# Patient Record
Sex: Female | Born: 1955 | Race: White | Hispanic: No | Smoking: Current some day smoker
Health system: Southern US, Community
[De-identification: ages and names within clinical notes are randomized; demographics above are authoritative.]

## PROBLEM LIST (undated history)

## (undated) DIAGNOSIS — I219 Acute myocardial infarction, unspecified: Secondary | ICD-10-CM

## (undated) DIAGNOSIS — I10 Essential (primary) hypertension: Secondary | ICD-10-CM

## (undated) DIAGNOSIS — I739 Peripheral vascular disease, unspecified: Secondary | ICD-10-CM

## (undated) DIAGNOSIS — I2510 Atherosclerotic heart disease of native coronary artery without angina pectoris: Secondary | ICD-10-CM

## (undated) DIAGNOSIS — K219 Gastro-esophageal reflux disease without esophagitis: Secondary | ICD-10-CM

## (undated) DIAGNOSIS — E119 Type 2 diabetes mellitus without complications: Secondary | ICD-10-CM

## (undated) DIAGNOSIS — I779 Disorder of arteries and arterioles, unspecified: Secondary | ICD-10-CM

## (undated) DIAGNOSIS — G2581 Restless legs syndrome: Secondary | ICD-10-CM

## (undated) DIAGNOSIS — E785 Hyperlipidemia, unspecified: Secondary | ICD-10-CM

## (undated) DIAGNOSIS — G459 Transient cerebral ischemic attack, unspecified: Secondary | ICD-10-CM

## (undated) DIAGNOSIS — B029 Zoster without complications: Secondary | ICD-10-CM

## (undated) HISTORY — DX: Type 2 diabetes mellitus without complications: E11.9

## (undated) HISTORY — PX: TUBAL LIGATION: SHX77

## (undated) HISTORY — DX: Essential (primary) hypertension: I10

## (undated) HISTORY — PX: COLONOSCOPY: SHX174

## (undated) HISTORY — DX: Hyperlipidemia, unspecified: E78.5

## (undated) HISTORY — DX: Atherosclerotic heart disease of native coronary artery without angina pectoris: I25.10

## (undated) HISTORY — PX: HEEL SPUR RESECTION: SHX6410

## (undated) HISTORY — DX: Disorder of arteries and arterioles, unspecified: I77.9

## (undated) HISTORY — PX: CYST EXCISION PERINEAL: SHX6278

## (undated) HISTORY — PX: CARDIAC CATHETERIZATION: SHX172

## (undated) HISTORY — PX: APPENDECTOMY: SHX54

## (undated) HISTORY — DX: Peripheral vascular disease, unspecified: I73.9

## (undated) HISTORY — PX: CRANIOTOMY: SHX93

---

## 1999-05-15 ENCOUNTER — Ambulatory Visit (HOSPITAL_BASED_OUTPATIENT_CLINIC_OR_DEPARTMENT_OTHER): Admission: RE | Admit: 1999-05-15 | Discharge: 1999-05-15 | Payer: Self-pay | Admitting: Orthopedic Surgery

## 1999-09-18 ENCOUNTER — Encounter: Payer: Self-pay | Admitting: Emergency Medicine

## 1999-09-18 ENCOUNTER — Emergency Department (HOSPITAL_COMMUNITY): Admission: EM | Admit: 1999-09-18 | Discharge: 1999-09-18 | Payer: Self-pay | Admitting: Emergency Medicine

## 1999-10-10 ENCOUNTER — Encounter: Payer: Self-pay | Admitting: Emergency Medicine

## 1999-10-10 ENCOUNTER — Encounter: Admission: RE | Admit: 1999-10-10 | Discharge: 1999-10-10 | Payer: Self-pay | Admitting: Emergency Medicine

## 1999-10-10 ENCOUNTER — Ambulatory Visit (HOSPITAL_COMMUNITY): Admission: RE | Admit: 1999-10-10 | Discharge: 1999-10-10 | Payer: Self-pay | Admitting: Emergency Medicine

## 2001-08-10 ENCOUNTER — Emergency Department (HOSPITAL_COMMUNITY): Admission: EM | Admit: 2001-08-10 | Discharge: 2001-08-10 | Payer: Self-pay

## 2004-09-30 ENCOUNTER — Encounter: Admission: RE | Admit: 2004-09-30 | Discharge: 2004-11-11 | Payer: Self-pay | Admitting: Emergency Medicine

## 2006-01-20 ENCOUNTER — Encounter: Admission: RE | Admit: 2006-01-20 | Discharge: 2006-03-03 | Payer: Self-pay | Admitting: Emergency Medicine

## 2009-07-07 DIAGNOSIS — B029 Zoster without complications: Secondary | ICD-10-CM

## 2009-07-07 HISTORY — DX: Zoster without complications: B02.9

## 2015-09-19 ENCOUNTER — Telehealth: Payer: Self-pay | Admitting: Cardiology

## 2015-09-19 NOTE — Telephone Encounter (Signed)
[facility] records [facility] for appointment on 09/21/15 with Dr Jens Somrenshaw.  [facility] for Dr Ludwig Clarksrenshaw's schedule on 09/21/15. lp

## 2015-09-20 NOTE — Progress Notes (Signed)
HPI: 60 year old female for evaluation of coronary artery disease. Previously followed in [place]. Patient had a non-ST elevation myocardial infarction in September 2016. Cardiac catheterization at that time revealed normal left main, 40-50% mid LAD, 10-20% proximal circumflex, 99% second obtuse marginal and 40-50% mid RCA. There was spasm of the RCA noted. Patient had PCI of the obtuse marginal with drug-eluting stent. Echocardiogram showed normal overall LV function, mild aortic/mitral/tricuspid regurgitation. Patient had a monitor performed in December 2016 secondary to presyncope. Based on report it showed sinus rhythm with periods of sinus tachycardia and sinus bradycardia. There were sinus pauses noted during sleeping hours. Her beta blocker was discontinued. Strips not available. Patient has occasional dyspnea related to brilinta but not severe. She has not had recurrent chest pain. She was having dizzy spells prior to metoprolol being discontinued but not at present. She has bilateral lower extremity pain with ambulating 30 yards.  Current Outpatient Prescriptions  Medication Sig Dispense Refill  . amLODipine (NORVASC) 5 MG tablet Take 5 mg by mouth daily.    Marland Kitchen aspirin EC 81 MG tablet Take 81 mg by mouth daily.    Marland Kitchen buPROPion (WELLBUTRIN) 75 MG tablet Take 75 mg by mouth daily.    . Calcium Carbonate-Vitamin D (CALCIUM-VITAMIN D) 500-200 MG-UNIT tablet Take 1 tablet by mouth 2 (two) times daily.    . chlorthalidone (HYGROTON) 25 MG tablet Take 25 mg by mouth daily.    . Loratadine (CLARITIN) 10 MG CAPS Take 10 mg by mouth daily.    . metFORMIN (GLUCOPHAGE) 500 MG tablet Take 500 mg by mouth 2 (two) times daily.    . nitroGLYCERIN (NITROSTAT) 0.4 MG SL tablet Place 1 tablet under the tongue as needed.    . Omega-3 Fatty Acids (FISH OIL) 1000 MG CAPS Take 1 capsule by mouth 2 (two) times daily.    . ticagrelor (BRILINTA) 90 MG TABS tablet Take 90 mg by mouth 2 (two) times daily.      No current facility-administered medications for this visit.    Allergies  Allergen Reactions  . Carbamazepine     Other reaction(s): Other Abnormal labs, liver enzymes were elevated  . Dilantin  [Phenytoin] Hives  . Other Other (See Comments)    Elevated liver function     Past Medical History  Diagnosis Date  . CAD (coronary artery disease)   . Diabetes mellitus (HCC)   . Hypertension   . Hyperlipidemia     Past Surgical History  Procedure Laterality Date  . Appendectomy    . Heel spur resection      Social History   Social History  . Marital Status: Widowed    Spouse Name: N/A  . Number of Children: 2  . Years of Education: N/A   Occupational History  . Not on file.   Social History Main Topics  . Smoking status: Current Every Day Smoker -- 0.50 packs/day    Types: Cigarettes  . Smokeless tobacco: Not on file  . Alcohol Use: No  . Drug Use: Not on file  . Sexual Activity: Not on file   Other Topics Concern  . Not on file   Social History Narrative  . No narrative on file    Family History  Problem Relation Age of Onset  . CAD Son   . CAD Father     ROS: Claudication but no fevers or chills, productive cough, hemoptysis, dysphasia, odynophagia, melena, hematochezia, dysuria, hematuria, rash, seizure activity, orthopnea, PND, pedal edema.  Remaining systems are negative.  Physical Exam:   Blood pressure 130/60, pulse 96, height 5\' 3"  (1.6 m), weight 158 lb 11.2 oz (71.986 kg).  General:  Well developed/well nourished in NAD Skin warm/dry Patient not depressed No peripheral clubbing Back-normal HEENT-normal/normal eyelids Neck supple/normal carotid upstroke bilaterally; bilateral bruits; no JVD; no thyromegaly chest - CTA/ normal expansion CV - RRR/normal S1 and S2; no murmurs, rubs or gallops;  PMI nondisplaced Abdomen -NT/ND, no HSM, no mass, + bowel sounds, no bruit 1+ femoral pulses, no bruits Ext-no edema, chords, Diminished distal  pulses Neuro-grossly nonfocal  ECG Sinus rhythm at a rate of 96. Cannot rule out prior septal infarct. Left axis deviation.

## 2015-09-21 ENCOUNTER — Encounter: Payer: Self-pay | Admitting: Cardiology

## 2015-09-21 ENCOUNTER — Ambulatory Visit (INDEPENDENT_AMBULATORY_CARE_PROVIDER_SITE_OTHER): Payer: 59 | Admitting: Cardiology

## 2015-09-21 VITALS — BP 130/60 | HR 96 | Ht 63.0 in | Wt 158.7 lb

## 2015-09-21 DIAGNOSIS — E785 Hyperlipidemia, unspecified: Secondary | ICD-10-CM

## 2015-09-21 DIAGNOSIS — I2510 Atherosclerotic heart disease of native coronary artery without angina pectoris: Secondary | ICD-10-CM | POA: Insufficient documentation

## 2015-09-21 DIAGNOSIS — R0989 Other specified symptoms and signs involving the circulatory and respiratory systems: Secondary | ICD-10-CM

## 2015-09-21 DIAGNOSIS — I10 Essential (primary) hypertension: Secondary | ICD-10-CM | POA: Diagnosis not present

## 2015-09-21 DIAGNOSIS — I739 Peripheral vascular disease, unspecified: Secondary | ICD-10-CM | POA: Insufficient documentation

## 2015-09-21 DIAGNOSIS — Z720 Tobacco use: Secondary | ICD-10-CM | POA: Insufficient documentation

## 2015-09-21 MED ORDER — ROSUVASTATIN CALCIUM 40 MG PO TABS: 40.0000 mg | ORAL_TABLET | Freq: Every day | ORAL | Status: DC

## 2015-09-21 NOTE — Assessment & Plan Note (Addendum)
Patient sounds to be having bilateral lower extremity claudication. Check ABIs with Doppler. May need PV consult.

## 2015-09-21 NOTE — Patient Instructions (Signed)
Medication Instructions:   START ROSUVASTATIN 40 MG ONCE DAILY  Labwork:  Your physician recommends that you return for lab work in: 4 WEEKS = DO NOT EAT PRIOR TO LAB WORK  Testing/Procedures:  Your physician has requested that you have a carotid duplex. This test is an ultrasound of the carotid arteries in your neck. It looks at blood flow through these arteries that supply the brain with blood. Allow one hour for this exam. There are no restrictions or special instructions.   Your physician has requested that you have a lower extremity arterial duplex. During this test, ultrasound are used to evaluate arterial blood flow in the legs. Allow one hour for this exam. There are no restrictions or special instructions.   Follow-Up:  Your physician wants you to follow-up in: 6 MONTHS WITH DR Jens SomRENSHAW You will receive a reminder letter in the mail two months in advance. If you don't receive a letter, please call our office to schedule the follow-up appointment.

## 2015-09-21 NOTE — Assessment & Plan Note (Signed)
Continue ASA and brilinta; She is having occasional mild dyspnea that appears to not be significantly problematic. If it worsens we could transition to Plavix. Add statin.

## 2015-09-21 NOTE — Assessment & Plan Note (Signed)
Patient counseled on discontinuing. 

## 2015-09-21 NOTE — Assessment & Plan Note (Addendum)
Blood pressure controlled. Continue present medications. Avoid beta blockers given history of pauses.

## 2015-09-21 NOTE — Assessment & Plan Note (Signed)
Patient had myalgias with Lipitor. We will try Crestor 10 mg daily. Check lipids and liver in 4 weeks.

## 2015-09-21 NOTE — Assessment & Plan Note (Signed)
Schedule carotid Dopplers. 

## 2015-09-24 ENCOUNTER — Other Ambulatory Visit: Payer: Self-pay | Admitting: Cardiology

## 2015-09-24 DIAGNOSIS — I739 Peripheral vascular disease, unspecified: Secondary | ICD-10-CM

## 2015-10-03 ENCOUNTER — Ambulatory Visit (HOSPITAL_COMMUNITY)
Admission: RE | Admit: 2015-10-03 | Discharge: 2015-10-03 | Disposition: A | Discharge status: Home / Self Care | Payer: 59 | Source: Ambulatory Visit | Attending: Cardiovascular Disease | Admitting: Cardiovascular Disease

## 2015-10-03 DIAGNOSIS — I10 Essential (primary) hypertension: Secondary | ICD-10-CM | POA: Insufficient documentation

## 2015-10-03 DIAGNOSIS — R0989 Other specified symptoms and signs involving the circulatory and respiratory systems: Secondary | ICD-10-CM | POA: Insufficient documentation

## 2015-10-03 DIAGNOSIS — I739 Peripheral vascular disease, unspecified: Secondary | ICD-10-CM

## 2015-10-03 DIAGNOSIS — I6523 Occlusion and stenosis of bilateral carotid arteries: Secondary | ICD-10-CM | POA: Diagnosis not present

## 2015-10-03 DIAGNOSIS — E785 Hyperlipidemia, unspecified: Secondary | ICD-10-CM | POA: Insufficient documentation

## 2015-10-03 DIAGNOSIS — I70203 Unspecified atherosclerosis of native arteries of extremities, bilateral legs: Secondary | ICD-10-CM | POA: Insufficient documentation

## 2015-10-03 DIAGNOSIS — E119 Type 2 diabetes mellitus without complications: Secondary | ICD-10-CM | POA: Diagnosis not present

## 2015-10-03 DIAGNOSIS — I2510 Atherosclerotic heart disease of native coronary artery without angina pectoris: Secondary | ICD-10-CM | POA: Insufficient documentation

## 2015-10-03 DIAGNOSIS — I745 Embolism and thrombosis of iliac artery: Secondary | ICD-10-CM | POA: Insufficient documentation

## 2015-10-03 DIAGNOSIS — R938 Abnormal findings on diagnostic imaging of other specified body structures: Secondary | ICD-10-CM | POA: Diagnosis not present

## 2015-10-05 ENCOUNTER — Encounter: Payer: Self-pay | Admitting: Cardiology

## 2015-10-05 NOTE — Telephone Encounter (Signed)
New message  ° ° °Patient calling for test results.   °

## 2015-10-05 NOTE — Telephone Encounter (Signed)
This encounter was created in error - please disregard.

## 2015-10-19 ENCOUNTER — Encounter: Payer: 59 | Admitting: Cardiovascular Disease

## 2015-10-23 ENCOUNTER — Encounter: Payer: Self-pay | Admitting: *Deleted

## 2015-10-23 LAB — LIPID PANEL
CHOL/HDL RATIO: 2.9 ratio (ref <=5.0)
CHOLESTEROL: 123 mg/dL — AB (ref 125–200)
HDL: 43 mg/dL — ABNORMAL LOW (ref >=46)
LDL Cholesterol: 59 mg/dL (ref <130)
Triglycerides: 105 mg/dL (ref <150)
VLDL: 21 mg/dL (ref <30)

## 2015-10-23 LAB — HEPATIC FUNCTION PANEL
ALK PHOS: 83 U/L (ref 33–130)
ALT: 14 U/L (ref 6–29)
AST: 14 U/L (ref 10–35)
Albumin: 4 g/dL (ref 3.6–5.1)
BILIRUBIN DIRECT: 0.1 mg/dL (ref <=0.2)
BILIRUBIN INDIRECT: 0.3 mg/dL (ref 0.2–1.2)
BILIRUBIN TOTAL: 0.4 mg/dL (ref 0.2–1.2)
Total Protein: 6.7 g/dL (ref 6.1–8.1)

## 2015-10-30 ENCOUNTER — Encounter: Payer: Self-pay | Admitting: Cardiovascular Disease

## 2015-10-30 ENCOUNTER — Ambulatory Visit (INDEPENDENT_AMBULATORY_CARE_PROVIDER_SITE_OTHER): Payer: 59 | Admitting: Cardiovascular Disease

## 2015-10-30 ENCOUNTER — Other Ambulatory Visit: Payer: Self-pay | Admitting: *Deleted

## 2015-10-30 ENCOUNTER — Ambulatory Visit
Admission: RE | Admit: 2015-10-30 | Discharge: 2015-10-30 | Disposition: A | Discharge status: Home / Self Care | Payer: 59 | Source: Ambulatory Visit | Attending: Cardiovascular Disease | Admitting: Cardiovascular Disease

## 2015-10-30 DIAGNOSIS — I779 Disorder of arteries and arterioles, unspecified: Secondary | ICD-10-CM | POA: Insufficient documentation

## 2015-10-30 DIAGNOSIS — I739 Peripheral vascular disease, unspecified: Secondary | ICD-10-CM | POA: Diagnosis not present

## 2015-10-30 LAB — BASIC METABOLIC PANEL
BUN: 11 mg/dL (ref 7–25)
CHLORIDE: 101 mmol/L (ref 98–110)
CO2: 27 mmol/L (ref 20–31)
Calcium: 9.5 mg/dL (ref 8.6–10.4)
Creat: 0.69 mg/dL (ref 0.50–0.99)
Glucose, Bld: 205 mg/dL — ABNORMAL HIGH (ref 65–99)
POTASSIUM: 4 mmol/L (ref 3.5–5.3)
SODIUM: 138 mmol/L (ref 135–146)

## 2015-10-30 LAB — CBC WITH DIFFERENTIAL/PLATELET
BASOS PCT: 0 %
Basophils Absolute: 0 cells/uL (ref 0–200)
EOS ABS: 178 {cells}/uL (ref 15–500)
Eosinophils Relative: 2 %
HEMATOCRIT: 33.5 % — AB (ref 35.0–45.0)
HEMOGLOBIN: 10 g/dL — AB (ref 11.7–15.5)
LYMPHS ABS: 2225 {cells}/uL (ref 850–3900)
Lymphocytes Relative: 25 %
MCH: 22.5 pg — ABNORMAL LOW (ref 27.0–33.0)
MCHC: 29.9 g/dL — ABNORMAL LOW (ref 32.0–36.0)
MCV: 75.3 fL — AB (ref 80.0–100.0)
MONO ABS: 801 {cells}/uL (ref 200–950)
MPV: 9 fL (ref 7.5–12.5)
Monocytes Relative: 9 %
NEUTROS ABS: 5696 {cells}/uL (ref 1500–7800)
Neutrophils Relative %: 64 %
Platelets: 359 10*3/uL (ref 140–400)
RBC: 4.45 MIL/uL (ref 3.80–5.10)
RDW: 16.8 % — ABNORMAL HIGH (ref 11.0–15.0)
WBC: 8.9 10*3/uL (ref 3.8–10.8)

## 2015-10-30 LAB — TSH: TSH: 1.32 m[IU]/L

## 2015-10-30 LAB — PROTIME-INR
INR: 1 (ref <1.50)
Prothrombin Time: 13.3 seconds (ref 11.6–15.2)

## 2015-10-30 LAB — APTT: aPTT: 32 seconds (ref 24–37)

## 2015-10-30 NOTE — Patient Instructions (Signed)
Medication Instructions:  Your physician recommends that you continue on your current medications as directed. Please refer to the Current Medication list given to you today.   Labwork: Your physician recommends that you return for lab work in: TODAY (bmet, cbc, tsh, ptt, pt/inr) The lab can be found on the FIRST FLOOR of out building in [address]   Testing/Procedures: Dr. Allyson SabalBerry has ordered a peripheral [facility].  This procedure is going to look at the bloodflow in your lower extremities.  If Dr. Allyson SabalBerry is able to open up the arteries, you will have to spend one night in the hospital.  If he is not able to open the arteries, you will be able to go home that same day.    After the procedure, you will not be allowed to drive for 3 days or push, pull, or lift anything greater than 10 lbs for one week.    You will be required to have the following tests prior to the procedure:  1. Blood work-the blood work can be done no more than 7 days prior to the procedure.  [facility] be done at any [facility] lab.  There is one downstairs on the first floor of this building and one in the [facility] building [phone]([address], [address])  2. Chest Xray-the chest xray order has [facility].     *REPS: Minerva AreolaEric and Todd  Puncture site: Right Groin      Any Other Special Instructions Will Be Listed Below (If Applicable).     If you need a refill on your cardiac medications before your next appointment, please call your pharmacy.

## 2015-10-30 NOTE — Assessment & Plan Note (Signed)
History of bilateral lower extremity lifestyle limiting claudication over the last several months at less than 100 feet. Dopplers performed in the office 10/03/15 revealed an occluded right common iliac and high-grade left common iliac artery stenosis with right ABI 0.68 and a left 89. She will need angiography and potential endovascular therapy. I have gone over the procedure in detail the patient agrees to proceed.

## 2015-10-30 NOTE — Assessment & Plan Note (Signed)
[name] has moderate bilateral eyes see a disease by recent duplex ultrasound 10/03/15 left greater than right. She is neurologically asymptomatic on dual antiplatelet therapy.

## 2015-10-30 NOTE — Progress Notes (Signed)
   10/30/2015 Debra Dennis   06/23/1956  [id number]  Primary Physician CYNTHIA BUTLER, DO Primary Cardiologist: Omelia Marquart J. Kalden Wanke MD FACP,FACC,FAHA, FSCAI   HPI:  Debra Dennis is a 59-year-old moderately overweight widowed Caucasian female mother of 2 children, grandmother and one grandchild referred by Dr. Crenshaw for peripheral vascular evaluation because of claudication. Her primary care provider is Dr. Cynthia Butler. 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 Risk factors include 40 pack years of tobacco abuse currently smoking one half pack per day, treated hypertension, hyperlipidemia and diabetes. She did have a non-ST segment elevation myocardial infarctions in September of last year and had stenting of her second obtuse marginal branch with a drug-eluting stent. She is on total and 5 platelet therapy. Since that time she's noticed less tolerating claudication which is symmetric. She had Dopplers performed in our office 3/29/17revealing a right ABI 0.68 with an occluded right common iliac and left ABI 0.9 with high-grade left common iliac artery stenosis. She also has moderate bilateral internal carotid artery stenosis by duplex ultrasound left greater than right.   Current Outpatient Prescriptions  Medication Sig Dispense Refill  . amLODipine (NORVASC) 5 MG tablet Take 5 mg by mouth daily.    . aspirin EC 81 MG tablet Take 81 mg by mouth daily.    . buPROPion (WELLBUTRIN) 75 MG tablet Take 75 mg by mouth daily.    . Calcium Carbonate-Vitamin D (CALCIUM-VITAMIN D) 500-200 MG-UNIT tablet Take 1 tablet by mouth 2 (two) times daily.    . chlorthalidone (HYGROTON) 25 MG tablet Take 25 mg by mouth daily.    . Loratadine (CLARITIN) 10 MG CAPS Take 10 mg by mouth daily.    . metFORMIN (GLUCOPHAGE) 500 MG tablet Take 500 mg by mouth 2 (two) times daily.    . nitroGLYCERIN (NITROSTAT) 0.4 MG SL tablet Place 1 tablet under the tongue as needed.    . Omega-3 Fatty Acids (FISH  OIL) 1000 MG CAPS Take 1 capsule by mouth 2 (two) times daily.    . rosuvastatin (CRESTOR) 40 MG tablet Take 1 tablet (40 mg total) by mouth daily. 90 tablet 3  . ticagrelor (BRILINTA) 90 MG TABS tablet Take 90 mg by mouth 2 (two) times daily.     No current facility-administered medications for this visit.    Allergies  Allergen Reactions  . Carbamazepine     Other reaction(s): Other Abnormal labs, liver enzymes were elevated  . Dilantin  [Phenytoin] Hives  . Other Other (See Comments)    Elevated liver function    Social History   Social History  . Marital Status: Widowed    Spouse Name: N/A  . Number of Children: 2  . Years of Education: N/A   Occupational History  . Not on file.   Social History Main Topics  . Smoking status: Current Every Day Smoker -- 0.50 packs/day    Types: Cigarettes  . Smokeless tobacco: Not on file  . Alcohol Use: No  . Drug Use: Not on file  . Sexual Activity: Not on file   Other Topics Concern  . Not on file   Social History Narrative     Review of Systems: General: negative for chills, fever, night sweats or weight changes.  Cardiovascular: negative for chest pain, dyspnea on exertion, edema, orthopnea, palpitations, paroxysmal nocturnal dyspnea or shortness of breath Dermatological: negative for rash Respiratory: negative for cough or wheezing Urologic: negative for hematuria Abdominal: negative for nausea,   vomiting, diarrhea, bright red blood per rectum, melena, or hematemesis Neurologic: negative for visual changes, syncope, or dizziness All other systems reviewed and are otherwise negative except as noted above.    Height 5\' 3"  (1.6 m), weight 165 lb (74.844 kg).  General appearance: alert and no distress Neck: no adenopathy, no JVD, supple, symmetrical, trachea midline, thyroid not enlarged, symmetric, no tenderness/mass/nodules and bilateral carotid bruits Lungs: clear to auscultation bilaterally Heart: regular rate and  rhythm, S1, S2 normal, no murmur, click, rub or gallop Extremities: extremities normal, atraumatic, no cyanosis or edema  EKG not performed today  ASSESSMENT AND PLAN:   Bilateral carotid artery disease [facility](HCC) Debra Dennis has moderate bilateral eyes see a disease by recent duplex ultrasound 10/03/15 left greater than right. She is neurologically asymptomatic on dual antiplatelet therapy.  Claudication [facility](HCC) History of bilateral lower extremity lifestyle limiting claudication over the last several months at less than 100 feet. Dopplers performed in the office 10/03/15 revealed an occluded right common iliac and high-grade left common iliac artery stenosis with right ABI 0.68 and a left 89. She will need angiography and potential endovascular therapy. I have gone over the procedure in detail the patient agrees to proceed.      Runell GessJonathan J. Edna Rede MD FACP,FACC,FAHA, [facility] 10/30/2015 10:29 AM

## 2015-11-05 ENCOUNTER — Encounter (HOSPITAL_COMMUNITY)
Admission: RE | Disposition: A | Discharge status: Home / Self Care | Payer: Self-pay | Source: Ambulatory Visit | Attending: Cardiovascular Disease

## 2015-11-05 ENCOUNTER — Ambulatory Visit (HOSPITAL_COMMUNITY)
Admission: RE | Admit: 2015-11-05 | Discharge: 2015-11-05 | Disposition: A | Discharge status: Home / Self Care | Payer: 59 | Source: Ambulatory Visit | Attending: Cardiovascular Disease | Admitting: Cardiovascular Disease

## 2015-11-05 ENCOUNTER — Encounter (HOSPITAL_COMMUNITY): Payer: Self-pay | Admitting: Cardiovascular Disease

## 2015-11-05 DIAGNOSIS — E785 Hyperlipidemia, unspecified: Secondary | ICD-10-CM | POA: Diagnosis not present

## 2015-11-05 DIAGNOSIS — Z955 Presence of coronary angioplasty implant and graft: Secondary | ICD-10-CM | POA: Diagnosis not present

## 2015-11-05 DIAGNOSIS — I70213 Atherosclerosis of native arteries of extremities with intermittent claudication, bilateral legs: Secondary | ICD-10-CM

## 2015-11-05 DIAGNOSIS — I739 Peripheral vascular disease, unspecified: Secondary | ICD-10-CM | POA: Diagnosis present

## 2015-11-05 DIAGNOSIS — I252 Old myocardial infarction: Secondary | ICD-10-CM | POA: Diagnosis not present

## 2015-11-05 DIAGNOSIS — E1151 Type 2 diabetes mellitus with diabetic peripheral angiopathy without gangrene: Secondary | ICD-10-CM | POA: Diagnosis not present

## 2015-11-05 DIAGNOSIS — F17210 Nicotine dependence, cigarettes, uncomplicated: Secondary | ICD-10-CM | POA: Insufficient documentation

## 2015-11-05 DIAGNOSIS — Z7982 Long term (current) use of aspirin: Secondary | ICD-10-CM | POA: Diagnosis not present

## 2015-11-05 DIAGNOSIS — Z7984 Long term (current) use of oral hypoglycemic drugs: Secondary | ICD-10-CM | POA: Diagnosis not present

## 2015-11-05 DIAGNOSIS — I7092 Chronic total occlusion of artery of the extremities: Secondary | ICD-10-CM | POA: Diagnosis not present

## 2015-11-05 DIAGNOSIS — I10 Essential (primary) hypertension: Secondary | ICD-10-CM | POA: Diagnosis not present

## 2015-11-05 DIAGNOSIS — I6523 Occlusion and stenosis of bilateral carotid arteries: Secondary | ICD-10-CM | POA: Insufficient documentation

## 2015-11-05 HISTORY — PX: PERIPHERAL VASCULAR CATHETERIZATION: SHX172C

## 2015-11-05 LAB — GLUCOSE, CAPILLARY
Glucose-Capillary: 181 mg/dL — ABNORMAL HIGH (ref 65–99)
Glucose-Capillary: 159 mg/dL — ABNORMAL HIGH (ref 65–99)

## 2015-11-05 SURGERY — LOWER EXTREMITY ANGIOGRAPHY

## 2015-11-05 MED ORDER — HEPARIN (PORCINE) IN NACL 2-0.9 UNIT/ML-% IJ SOLN
INTRAMUSCULAR | Status: AC
  Filled 2015-11-05: qty 1000

## 2015-11-05 MED ORDER — HEPARIN (PORCINE) IN NACL 2-0.9 UNIT/ML-% IJ SOLN
INTRAMUSCULAR | Status: DC | PRN
  Administered 2015-11-05: 1000 mL

## 2015-11-05 MED ORDER — SODIUM CHLORIDE 0.9 % WEIGHT BASED INFUSION
1.0000 mL/kg/h | INTRAVENOUS | Status: DC
  Administered 2015-11-05: 1 mL/kg/h via INTRAVENOUS

## 2015-11-05 MED ORDER — SODIUM CHLORIDE 0.9 % IV SOLN: INTRAVENOUS | Status: DC

## 2015-11-05 MED ORDER — ASPIRIN 81 MG PO CHEW
81.0000 mg | CHEWABLE_TABLET | ORAL | Status: DC
Start: 2015-11-05 — End: 2015-11-05

## 2015-11-05 MED ORDER — NITROGLYCERIN 1 MG/10 ML FOR IR/CATH LAB
INTRA_ARTERIAL | Status: AC
  Filled 2015-11-05: qty 10

## 2015-11-05 MED ORDER — ACETAMINOPHEN 325 MG PO TABS: 650.0000 mg | ORAL_TABLET | ORAL | Status: DC | PRN

## 2015-11-05 MED ORDER — MIDAZOLAM HCL 2 MG/2ML IJ SOLN
INTRAMUSCULAR | Status: DC | PRN
  Administered 2015-11-05 (×2): 1 mg via INTRAVENOUS

## 2015-11-05 MED ORDER — MORPHINE SULFATE (PF) 2 MG/ML IV SOLN: 2.0000 mg | INTRAVENOUS | Status: DC | PRN

## 2015-11-05 MED ORDER — FENTANYL CITRATE (PF) 100 MCG/2ML IJ SOLN
INTRAMUSCULAR | Status: AC
  Filled 2015-11-05: qty 2

## 2015-11-05 MED ORDER — IODIXANOL 320 MG/ML IV SOLN
INTRAVENOUS | Status: DC | PRN
  Administered 2015-11-05: 150 mL via INTRAVENOUS

## 2015-11-05 MED ORDER — ASPIRIN EC 325 MG PO TBEC: 325.0000 mg | DELAYED_RELEASE_TABLET | Freq: Every day | ORAL | Status: DC

## 2015-11-05 MED ORDER — LIDOCAINE HCL (PF) 1 % IJ SOLN
INTRAMUSCULAR | Status: DC | PRN
  Administered 2015-11-05: 2 mL
  Administered 2015-11-05: 25 mL

## 2015-11-05 MED ORDER — SODIUM CHLORIDE 0.9% FLUSH: 3.0000 mL | INTRAVENOUS | Status: DC | PRN

## 2015-11-05 MED ORDER — SODIUM CHLORIDE 0.9 % WEIGHT BASED INFUSION
3.0000 mL/kg/h | INTRAVENOUS | Status: AC
  Administered 2015-11-05: 3 mL/kg/h via INTRAVENOUS

## 2015-11-05 MED ORDER — VERAPAMIL HCL 2.5 MG/ML IV SOLN
INTRAVENOUS | Status: AC
  Filled 2015-11-05: qty 2

## 2015-11-05 MED ORDER — MIDAZOLAM HCL 2 MG/2ML IJ SOLN
INTRAMUSCULAR | Status: AC
  Filled 2015-11-05: qty 2

## 2015-11-05 MED ORDER — FENTANYL CITRATE (PF) 100 MCG/2ML IJ SOLN
INTRAMUSCULAR | Status: DC | PRN
  Administered 2015-11-05 (×2): 25 ug via INTRAVENOUS

## 2015-11-05 MED ORDER — VERAPAMIL HCL 2.5 MG/ML IV SOLN
INTRA_ARTERIAL | Status: DC | PRN
  Administered 2015-11-05 (×2): 10 mL via INTRA_ARTERIAL

## 2015-11-05 MED ORDER — ONDANSETRON HCL 4 MG/2ML IJ SOLN: 4.0000 mg | Freq: Four times a day (QID) | INTRAMUSCULAR | Status: DC | PRN

## 2015-11-05 MED ORDER — LIDOCAINE HCL (PF) 1 % IJ SOLN
INTRAMUSCULAR | Status: AC
  Filled 2015-11-05: qty 30

## 2015-11-05 SURGICAL SUPPLY — 14 items
CATH ANGIO 5F PIGTAIL 100CM (CATHETERS) ×4 IMPLANT
CATH INFINITI JR4 5F (CATHETERS) ×4 IMPLANT
DEVICE RAD COMP TR BAND LRG (VASCULAR PRODUCTS) ×4 IMPLANT
GLIDESHEATH SLEND A-KIT 6F 22G (SHEATH) ×4 IMPLANT
KIT PV (KITS) ×4 IMPLANT
NEEDLE SMART REG 18GX2-3/4 (NEEDLE) ×4 IMPLANT
SHEATH PINNACLE 5F 10CM (SHEATH) ×4 IMPLANT
STOPCOCK MORSE 400PSI 3WAY (MISCELLANEOUS) ×4 IMPLANT
SYRINGE MEDRAD AVANTA MACH 7 (SYRINGE) ×4 IMPLANT
TRANSDUCER W/STOPCOCK (MISCELLANEOUS) ×4 IMPLANT
TRAY PV CATH (CUSTOM PROCEDURE TRAY) ×4 IMPLANT
TUBING CIL FLEX 10 FLL-RA (TUBING) ×8 IMPLANT
WIRE HITORQ VERSACORE ST 145CM (WIRE) ×4 IMPLANT
WIRE SAFE-T 1.5MM-J .035X260CM (WIRE) ×4 IMPLANT

## 2015-11-05 NOTE — H&P (View-Only) (Signed)
10/30/2015 [name]   [birth year]  [id number]  Primary Physician Samuel Jester, DO Primary Cardiologist: Runell Gess MD Roseanne Reno   HPI:  [name] is a 60 year old moderately overweight widowed Caucasian female mother of 2 children, grandmother and one grandchild referred by Dr. Jens Som for peripheral vascular evaluation because of claudication. Her primary care provider is Dr. Samuel Jester. 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 Risk factors include 40 pack years of tobacco abuse currently smoking one half pack per day, treated hypertension, hyperlipidemia and diabetes. She did have a non-ST segment elevation myocardial infarctions in September of last year and had stenting of her second obtuse marginal branch with a drug-eluting stent. She is on total and 5 platelet therapy. Since that time she's noticed less tolerating claudication which is symmetric. She had Dopplers performed in our office 3/29/17revealing a right ABI 0.68 with an occluded right common iliac and left ABI 0.9 with high-grade left common iliac artery stenosis. She also has moderate bilateral internal carotid artery stenosis by duplex ultrasound left greater than right.   Current Outpatient Prescriptions  Medication Sig Dispense Refill  . amLODipine (NORVASC) 5 MG tablet Take 5 mg by mouth daily.    Marland Kitchen aspirin EC 81 MG tablet Take 81 mg by mouth daily.    Marland Kitchen buPROPion (WELLBUTRIN) 75 MG tablet Take 75 mg by mouth daily.    . Calcium Carbonate-Vitamin D (CALCIUM-VITAMIN D) 500-200 MG-UNIT tablet Take 1 tablet by mouth 2 (two) times daily.    . chlorthalidone (HYGROTON) 25 MG tablet Take 25 mg by mouth daily.    . Loratadine (CLARITIN) 10 MG CAPS Take 10 mg by mouth daily.    . metFORMIN (GLUCOPHAGE) 500 MG tablet Take 500 mg by mouth 2 (two) times daily.    . nitroGLYCERIN (NITROSTAT) 0.4 MG SL tablet Place 1 tablet under the tongue as needed.    . Omega-3 Fatty Acids (FISH  OIL) 1000 MG CAPS Take 1 capsule by mouth 2 (two) times daily.    . rosuvastatin (CRESTOR) 40 MG tablet Take 1 tablet (40 mg total) by mouth daily. 90 tablet 3  . ticagrelor (BRILINTA) 90 MG TABS tablet Take 90 mg by mouth 2 (two) times daily.     No current facility-administered medications for this visit.    Allergies  Allergen Reactions  . Carbamazepine     Other reaction(s): Other Abnormal labs, liver enzymes were elevated  . Dilantin  [Phenytoin] Hives  . Other Other (See Comments)    Elevated liver function    Social History   Social History  . Marital Status: Widowed    Spouse Name: N/A  . Number of Children: 2  . Years of Education: N/A   Occupational History  . Not on file.   Social History Main Topics  . Smoking status: Current Every Day Smoker -- 0.50 packs/day    Types: Cigarettes  . Smokeless tobacco: Not on file  . Alcohol Use: No  . Drug Use: Not on file  . Sexual Activity: Not on file   Other Topics Concern  . Not on file   Social History Narrative     Review of Systems: General: negative for chills, fever, night sweats or weight changes.  Cardiovascular: negative for chest pain, dyspnea on exertion, edema, orthopnea, palpitations, paroxysmal nocturnal dyspnea or shortness of breath Dermatological: negative for rash Respiratory: negative for cough or wheezing Urologic: negative for hematuria Abdominal: negative for nausea,  vomiting, diarrhea, bright red blood per rectum, melena, or hematemesis Neurologic: negative for visual changes, syncope, or dizziness All other systems reviewed and are otherwise negative except as noted above.    Height 5\' 3"  (1.6 m), weight 165 lb (74.844 kg).  General appearance: alert and no distress Neck: no adenopathy, no JVD, supple, symmetrical, trachea midline, thyroid not enlarged, symmetric, no tenderness/mass/nodules and bilateral carotid bruits Lungs: clear to auscultation bilaterally Heart: regular rate and  rhythm, S1, S2 normal, no murmur, click, rub or gallop Extremities: extremities normal, atraumatic, no cyanosis or edema  EKG not performed today  ASSESSMENT AND PLAN:   Bilateral carotid artery disease [facility](HCC) Mrs. Murguia has moderate bilateral eyes see a disease by recent duplex ultrasound 10/03/15 left greater than right. She is neurologically asymptomatic on dual antiplatelet therapy.  Claudication [facility](HCC) History of bilateral lower extremity lifestyle limiting claudication over the last several months at less than 100 feet. Dopplers performed in the office 10/03/15 revealed an occluded right common iliac and high-grade left common iliac artery stenosis with right ABI 0.68 and a left 89. She will need angiography and potential endovascular therapy. I have gone over the procedure in detail the patient agrees to proceed.      Runell GessJonathan J. Yadiel Aubry MD FACP,FACC,FAHA, [facility] 10/30/2015 10:29 AM

## 2015-11-05 NOTE — Interval H&P Note (Signed)
History and Physical Interval Note:  11/05/2015 7:33 AM  Debra Dennis  has presented today for surgery, with the diagnosis of claudication  The various methods of treatment have been discussed with the patient and family. After consideration of risks, benefits and other options for treatment, the patient has consented to  Procedure(s): Lower Extremity Angiography (N/A) as a surgical intervention .  The patient's history has been reviewed, patient examined, no change in status, stable for surgery.  I have reviewed the patient's chart and labs.  Questions were answered to the patient's satisfaction.     Nanetta BattyBerry, Genee Rann

## 2015-11-05 NOTE — Discharge Instructions (Signed)
Radial Site Care °Refer to this sheet in the next few weeks. These instructions provide you with information about caring for yourself after your procedure. Your health care provider may also give you more specific instructions. Your treatment has been planned according to current medical practices, but problems sometimes occur. Call your health care provider if you have any problems or questions after your procedure. °WHAT TO EXPECT AFTER THE PROCEDURE °After your procedure, it is typical to have the following: °· Bruising at the radial site that usually fades within 1-2 weeks. °· Blood collecting in the tissue (hematoma) that may be painful to the touch. It should usually decrease in size and tenderness within 1-2 weeks. °HOME CARE INSTRUCTIONS °· Take medicines only as directed by your health care provider. °· You may shower 24-48 hours after the procedure or as directed by your health care provider. Remove the bandage (dressing) and gently wash the site with plain soap and water. Pat the area dry with a clean towel. Do not rub the site, because this may cause bleeding. °· Do not take baths, swim, or use a hot tub until your health care provider approves. °· Check your insertion site every day for redness, swelling, or drainage. °· Do not apply powder or lotion to the site. °· Do not flex or bend the affected arm for 24 hours or as directed by your health care provider. °· Do not push or pull heavy objects with the affected arm for 24 hours or as directed by your health care provider. °· Do not lift over 10 lb (4.5 kg) for 5 days after your procedure or as directed by your health care provider. °· Ask your health care provider when it is okay to: °¨ Return to work or school. °¨ Resume usual physical activities or sports. °¨ Resume sexual activity. °· Do not drive home if you are discharged the same day as the procedure. Have someone else drive you. °· You may drive 24 hours after the procedure unless otherwise  instructed by your health care provider. °· Do not operate machinery or power tools for 24 hours after the procedure. °· If your procedure was done as an outpatient procedure, which means that you went home the same day as your procedure, a responsible adult should be with you for the first 24 hours after you arrive home. °· Keep all follow-up visits as directed by your health care provider. This is important. °SEEK MEDICAL CARE IF: °· You have a fever. °· You have chills. °· You have increased bleeding from the radial site. Hold pressure on the site. °SEEK IMMEDIATE MEDICAL CARE IF: °· You have unusual pain at the radial site. °· You have redness, warmth, or swelling at the radial site. °· You have drainage (other than a small amount of blood on the dressing) from the radial site. °· The radial site is bleeding, and the bleeding does not stop after 30 minutes of holding steady pressure on the site. °· Your arm or hand becomes pale, cool, tingly, or numb. °  °This information is not intended to replace advice given to you by your health care provider. Make sure you discuss any questions you have with your health care provider. °  °Document Released: 07/26/2010 Document Revised: 07/14/2014 Document Reviewed: 01/09/2014 °Elsevier Interactive Patient Education ©2016 [employer] ° °

## 2015-11-07 ENCOUNTER — Telehealth: Payer: Self-pay | Admitting: Cardiovascular Disease

## 2015-11-07 NOTE — Telephone Encounter (Signed)
New message   Pt is calling to speak to Dr.Berry's Rn about her recent procedure

## 2015-11-07 NOTE — Telephone Encounter (Signed)
Spoke with pt, she was told when she was discharged from the hospital she would be getting a call about seeing a Careers advisersurgeon, she has not gotten any call. She wants to know if there is any pain medication he can give her to help with the pain in her legs while working. She also needs a note to return to work Monday and would like that faxed to her home fax # [phone]. Will forward for dr berry's review

## 2015-11-08 NOTE — Telephone Encounter (Signed)
It is okay for her to return to work. Once I see her back in the office I will refer her to Dr. Myra GianottiBrabham for surgical evaluation.

## 2015-11-09 NOTE — Telephone Encounter (Signed)
Left pt detailed message on home phone, okay per DPR. Letter faxed to line approving her to return to work on 5/8.  Also, informed pt that her referral for surgeon will be reviewed at her next appt with Dr. Allyson SabalBerry.

## 2015-11-12 ENCOUNTER — Telehealth: Payer: Self-pay | Admitting: Cardiovascular Disease

## 2015-11-12 NOTE — Telephone Encounter (Signed)
Pt received return to work letter.   Pt also wanted to setup f/u with Dr. Allyson SabalBerry and discuss next steps. Pt also told to take Acetaminophen for leg pain until f/u; 5/19.   Pt verbalized understanding, no questions at this time.

## 2015-11-12 NOTE — Telephone Encounter (Signed)
F/u  Pt returning RN phone call- concerning paperwork to return to work- Please call back and discuss.

## 2015-11-23 ENCOUNTER — Ambulatory Visit (INDEPENDENT_AMBULATORY_CARE_PROVIDER_SITE_OTHER): Payer: 59 | Admitting: Cardiovascular Disease

## 2015-11-23 ENCOUNTER — Encounter: Payer: Self-pay | Admitting: Cardiovascular Disease

## 2015-11-23 VITALS — BP 134/70 | HR 96 | Ht 63.0 in | Wt 161.0 lb

## 2015-11-23 DIAGNOSIS — I739 Peripheral vascular disease, unspecified: Secondary | ICD-10-CM

## 2015-11-23 DIAGNOSIS — Z01810 Encounter for preprocedural cardiovascular examination: Secondary | ICD-10-CM | POA: Diagnosis not present

## 2015-11-23 NOTE — Assessment & Plan Note (Signed)
[name] returns for follow-up after her recent lower extremity extremity arteriogram which I performed via the right radial approach on 11/05/15 because of severe lifestyle limiting claudication. She had high-grade distal dominant aortic stenosis, an occluded right iliac and high-grade left common iliac artery stenosis. This was not percutaneously addressable. She will require aortobifemoral bypass grafting.she had a circumflex obtuse marginal branch stent placed in September at [facility]. She will need to be on dual antiplatelet therapy for 12 months uninterrupted. I'm going to refer her to Dr. Myra GianottiBrabham for peripheral vascular evaluation and aortobifemoral bypass grafting in fall of this year.

## 2015-11-23 NOTE — Progress Notes (Signed)
11/23/2015 Debra HickBarbara Dennis   1956-05-05  [id number]  Primary Physician Samuel JesterYNTHIA BUTLER, DO Primary Cardiologist: Runell GessJonathan J. Berry MD Roseanne RenoFACP,FACC,FAHA, FSCAI   HPI:  .Debra Dennis is a 60 year old moderately overweight widowed Caucasian female mother of 2 children, grandmother and one grandchild referred by Dr. Jens Somrenshaw for peripheral vascular evaluation because of claudication. Her primary care provider is Dr. Samuel Jesterynthia Dennis. I initially saw her in the office 10/30/15. 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 Risk factors include 40 pack years of tobacco abuse currently smoking one half pack per day, treated hypertension, hyperlipidemia and diabetes. She did have a non-ST segment elevation myocardial infarctions in September of last year and had stenting of her second obtuse marginal branch with a drug-eluting stent. She is on total and 5 platelet therapy. Since that time she's noticed less tolerating claudication which is symmetric. She had Dopplers performed in our office 3/29/17revealing a right ABI 0.68 with an occluded right common iliac and left ABI 0.9 with high-grade left common iliac artery stenosis. She also has moderate bilateral internal carotid artery stenosis by duplex ultrasound left greater than right. She underwent lower extremity angiography by myself 11/05/15 via the right radial approach demonstrating severe aortoiliac disease not percutaneously addressable. She will need aortobifemoral bypass grafting. Because of her percutaneous coronary intervention in September 2016 at [facility] she will need to be on uninterrupted dual antibiotic therapy for at least 12 months therefore her vascular surgical procedure will need to be delayed until the fall of this year At that time, we'll get a pharmacologic Myoview stress test to risk stratify her. We also talked about the importance of smoking cessation. We will continue to follow her lower extremity Doppler studies here are  office after revascularization as well as her carotid Doppler studies.  Current Outpatient Prescriptions  Medication Sig Dispense Refill  . amLODipine (NORVASC) 5 MG tablet Take 5 mg by mouth daily.    Marland Kitchen. aspirin EC 81 MG tablet Take 81 mg by mouth daily.    Marland Kitchen. buPROPion (WELLBUTRIN SR) 150 MG 12 hr tablet Take 150 mg by mouth daily.    . Calcium Carbonate-Vitamin D (CALCIUM-VITAMIN D) 500-200 MG-UNIT tablet Take 1 tablet by mouth 2 (two) times daily.    . chlorthalidone (HYGROTON) 25 MG tablet Take 25 mg by mouth daily.    . Loratadine (CLARITIN) 10 MG CAPS Take 10 mg by mouth daily.    . metFORMIN (GLUCOPHAGE) 500 MG tablet Take 500 mg by mouth 2 (two) times daily.    . nitroGLYCERIN (NITROSTAT) 0.4 MG SL tablet Place 1 tablet under the tongue every 5 (five) minutes as needed for chest pain.     . Omega-3 Fatty Acids (FISH OIL) 1000 MG CAPS Take 1 capsule by mouth 2 (two) times daily.    Marland Kitchen. omeprazole (PRILOSEC) 20 MG capsule Take 20 mg by mouth 2 (two) times daily.    Marland Kitchen. rOPINIRole (REQUIP) 0.25 MG tablet Take 0.25 mg by mouth 3 (three) times daily.    . rosuvastatin (CRESTOR) 40 MG tablet Take 1 tablet (40 mg total) by mouth daily. 90 tablet 3  . ticagrelor (BRILINTA) 90 MG TABS tablet Take 90 mg by mouth 2 (two) times daily.     No current facility-administered medications for this visit.    Allergies  Allergen Reactions  . Carbamazepine     Other reaction(s): Other Abnormal labs, liver enzymes were elevated  . Dilantin  [Phenytoin] Hives  .  Other Other (See Comments)    Elevated liver function    Social History   Social History  . Marital Status: Widowed    Spouse Name: N/A  . Number of Children: 2  . Years of Education: N/A   Occupational History  . Not on file.   Social History Main Topics  . Smoking status: Current Every Day Smoker -- 0.50 packs/day    Types: Cigarettes  . Smokeless tobacco: Not on file  . Alcohol Use: No  . Drug Use: Not on file  . Sexual  Activity: Not on file   Other Topics Concern  . Not on file   Social History Narrative     Review of Systems: General: negative for chills, fever, night sweats or weight changes.  Cardiovascular: negative for chest pain, dyspnea on exertion, edema, orthopnea, palpitations, paroxysmal nocturnal dyspnea or shortness of breath Dermatological: negative for rash Respiratory: negative for cough or wheezing Urologic: negative for hematuria Abdominal: negative for nausea, vomiting, diarrhea, bright red blood per rectum, melena, or hematemesis Neurologic: negative for visual changes, syncope, or dizziness All other systems reviewed and are otherwise negative except as noted above.    Blood pressure 134/70, pulse 96, height  (1.6 m), weight 161 lb (73.029 kg), SpO2 96 %.  General appearance: alert and no distress Neck: no adenopathy, no JVD, supple, symmetrical, trachea midline, thyroid not enlarged, symmetric, no tenderness/mass/nodules and bilateral carotid bruits Lungs: clear to auscultation bilaterally Heart: regular rate and rhythm, S1, S2 normal, no murmur, click, rub or gallop Extremities: extremities normal, atraumatic, no cyanosis or edema  EKG not performed today  ASSESSMENT AND PLAN:   Claudication [facility]) Debra Dennis returns for follow-up after her recent lower extremity extremity arteriogram which I performed via the right radial approach on 11/05/15 because of severe lifestyle limiting claudication. She had high-grade distal dominant aortic stenosis, an occluded right iliac and high-grade left common iliac artery stenosis. This was not percutaneously addressable. She will require aortobifemoral bypass grafting.she had a circumflex obtuse marginal branch stent placed in September at [facility]. She will need to be on dual antiplatelet therapy for 12 months uninterrupted. I'm going to refer her to Dr. Myra Gianotti for peripheral vascular evaluation and aortobifemoral bypass grafting  in fall of this year.      Runell Gess MD FACP,FACC,FAHA, [facility] 11/23/2015 9:39 AM

## 2015-11-23 NOTE — Patient Instructions (Addendum)
Your physician has requested that you have a lexiscan myoview in August 2017. For further information please visit [email or web address] Please follow instruction sheet, as given.  You have been referred to Dr Janae BridgemanW Brabham at [facility].  Dr Allyson SabalBerry recommends that you schedule a follow-up appointment in 6 months. You will receive a reminder letter in the mail two months in advance. If you don't receive a letter, please call our office to schedule the follow-up appointment.  If you need a refill on your cardiac medications before your next appointment, please call your pharmacy.

## 2015-12-04 ENCOUNTER — Encounter: Payer: Self-pay | Admitting: Surgery

## 2015-12-10 ENCOUNTER — Encounter: Payer: Self-pay | Admitting: Surgery

## 2015-12-10 ENCOUNTER — Ambulatory Visit (INDEPENDENT_AMBULATORY_CARE_PROVIDER_SITE_OTHER): Payer: 59 | Admitting: Surgery

## 2015-12-10 VITALS — BP 127/70 | HR 79 | Temp 99.0°F | Resp 18 | Ht 63.0 in | Wt 165.0 lb

## 2015-12-10 DIAGNOSIS — I70213 Atherosclerosis of native arteries of extremities with intermittent claudication, bilateral legs: Secondary | ICD-10-CM | POA: Diagnosis not present

## 2015-12-10 NOTE — Progress Notes (Signed)
Vascular and Vein Specialist of [place]  Patient name: Debra Dennis MRN: [id number] DOB: 1956/04/07 Sex: female  REFERRING PHYSICIAN: Dr. Allyson Sabal  REASON FOR CONSULT: claudication  HPI: Debra Dennis is a 60 y.o. female, who is referred today for evaluation of bilateral claudication.  The patient has recently undergone angiography by Dr. Gery Pray which revealed a 90% ostial calcified left common iliac lesion and an occluded right common and external iliac artery.  The patient had three-vessel runoff bilaterally.  She was felt to not be a good candidate for percutaneous revascularization and therefore was referred for surgical evaluation.  The patient reports that she gets symptoms after walking approximately 100 feet.  Her right leg bothers were the same as the left.  She states that her legs feel tight and feel like they're going to give out.  She denies ulcers or rest pain.  The patient has a significant coronary artery history.  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  She suffers from hypercholesterolemia which is managed with a statin.  She is on dual antiplatelet therapy.  She continues to be a half a pack a day smoker.  She has undergone ablation for trigeminal neuralgia.  The patient has an abdominal surgical history consisting of appendectomy and tubal ligation  Past Medical History  Diagnosis Date  . CAD (coronary artery disease)   . Diabetes mellitus (HCC)   . Hypertension   . Hyperlipidemia   . Bilateral carotid artery disease (HCC)   . Claudication [facility])     Family History  Problem Relation Age of Onset  . CAD Son   . CAD Father     SOCIAL HISTORY: Social History   Social History  . Marital Status: Widowed    Spouse Name: N/A  . Number of Children: 2  . Years of Education: N/A   Occupational History  . Not on file.   Social History Main Topics  . Smoking status: Current Some Day  Smoker -- 0.50 packs/day    Types: Cigarettes  . Smokeless tobacco: Not on file  . Alcohol Use: No  . Drug Use: Not on file  . Sexual Activity: Not on file   Other Topics Concern  . Not on file   Social History Narrative    Allergies  Allergen Reactions  . Carbamazepine     Other reaction(s): Other Abnormal labs, liver enzymes were elevated  . Dilantin  [Phenytoin] Hives  . Other Other (See Comments)    Elevated liver function    Current Outpatient Prescriptions  Medication Sig Dispense Refill  . amLODipine (NORVASC) 5 MG tablet Take 5 mg by mouth daily.    Marland Kitchen aspirin EC 81 MG tablet Take 81 mg by mouth daily.    Marland Kitchen buPROPion (WELLBUTRIN SR) 150 MG 12 hr tablet Take 150 mg by mouth daily.    . Calcium Carbonate-Vitamin D (CALCIUM-VITAMIN D) 500-200 MG-UNIT tablet Take 1 tablet by mouth 2 (two) times daily.    . chlorthalidone (HYGROTON) 25 MG tablet Take 25 mg by mouth daily.    . Loratadine (CLARITIN) 10 MG CAPS Take 10 mg by mouth daily.    . metFORMIN (GLUCOPHAGE) 500 MG tablet Take 500 mg by mouth 2 (two) times daily.    . nitroGLYCERIN (NITROSTAT) 0.4 MG SL tablet Place 1 tablet under the tongue every 5 (five) minutes as needed for chest pain.     . Omega-3 Fatty Acids (FISH OIL) 1000 MG  CAPS Take 1 capsule by mouth 2 (two) times daily.    Marland Kitchen omeprazole (PRILOSEC) 20 MG capsule Take 20 mg by mouth 2 (two) times daily.    Marland Kitchen rOPINIRole (REQUIP) 0.25 MG tablet Take 0.25 mg by mouth 3 (three) times daily.    . rosuvastatin (CRESTOR) 40 MG tablet Take 1 tablet (40 mg total) by mouth daily. 90 tablet 3  . ticagrelor (BRILINTA) 90 MG TABS tablet Take 90 mg by mouth 2 (two) times daily.     No current facility-administered medications for this visit.    REVIEW OF SYSTEMS:   denotes positive finding,  denotes negative finding Cardiac  Comments:  Chest pain or chest pressure:    Shortness of breath upon exertion: x   Short of breath when lying flat:    Irregular heart  rhythm: x       Vascular    Pain in calf, thigh, or hip brought on by ambulation: x   Pain in feet at night that wakes you up from your sleep:     Blood clot in your veins:    Leg swelling:         Pulmonary    Oxygen at home:    Productive cough:     Wheezing:         Neurologic    Sudden weakness in arms or legs:     Sudden numbness in arms or legs:     Sudden onset of difficulty speaking or slurred speech:    Temporary loss of vision in one eye:     Problems with dizziness:         Gastrointestinal    Blood in stool:     Vomited blood:         Genitourinary    Burning when urinating:     Blood in urine:        Psychiatric    Major depression:         Hematologic    Bleeding problems:    Problems with blood clotting too easily:        Skin    Rashes or ulcers:        Constitutional    Fever or chills:      PHYSICAL EXAM: Filed Vitals:   12/10/15 0928  BP: 127/70  Pulse: 79  Temp: 99 F (37.2 C)  TempSrc: Oral  Resp: 18  Height:  (1.6 m)  Weight: 165 lb (74.844 kg)  SpO2: 96%    GENERAL: The patient is a well-nourished female, in no acute distress. The vital signs are documented above. CARDIAC: There is a regular rate and rhythm.  VASCULAR: Nonpalpable pedal pulses.  No carotid bruits PULMONARY: There is good air exchange bilaterally without wheezing or rales. ABDOMEN: Soft and non-tender with normal pitched bowel sounds.  MUSCULOSKELETAL: There are no major deformities or cyanosis. NEUROLOGIC: No focal weakness or paresthesias are detected. SKIN: There are no ulcers or rashes noted. PSYCHIATRIC: The patient has a normal affect.  DATA:  Carotid duplex: 40-59 percent right carotid stenosis 60-79% left carotid stenosis ABI: Right equal 0.68.  Left equal 0.9  MEDICAL ISSUES: Aortoiliac occlusive disease: The patient has lifestyle limiting claudication symptoms.  She is not a candidate for percutaneous intervention.  She will need an  aortobifemoral bypass graft.  She had a MI in September 2016 which was treated with a drug-eluting stent.  She will require dual antiplatelet therapy for 1 year.  I discussed the  details of the operation.  I am scheduling her to follow-up in early September for her preoperative visit and further discussions regarding aortobifemoral bypass graft.  I am going to get a CT angiogram prior to her visit for better definition of her anatomy.   Durene CalWells Yanci Bachtell, MD [facility] Tel [phone] Pager [phone]

## 2015-12-12 NOTE — Addendum Note (Signed)
Addended by: Melodye PedMANESS-HARRISON, Mikiah Demond C on: 12/12/2015 04:00 PM   Modules accepted: Orders

## 2016-02-21 ENCOUNTER — Telehealth (HOSPITAL_COMMUNITY): Payer: Self-pay

## 2016-02-21 NOTE — Telephone Encounter (Signed)
Encounter complete. 

## 2016-02-26 ENCOUNTER — Ambulatory Visit (HOSPITAL_COMMUNITY)
Admission: RE | Admit: 2016-02-26 | Discharge: 2016-02-26 | Disposition: A | Discharge status: Home / Self Care | Payer: 59 | Source: Ambulatory Visit | Attending: Cardiovascular Disease | Admitting: Cardiovascular Disease

## 2016-02-26 DIAGNOSIS — I739 Peripheral vascular disease, unspecified: Secondary | ICD-10-CM | POA: Diagnosis not present

## 2016-02-26 DIAGNOSIS — E669 Obesity, unspecified: Secondary | ICD-10-CM | POA: Insufficient documentation

## 2016-02-26 DIAGNOSIS — Z6829 Body mass index (BMI) 29.0-29.9, adult: Secondary | ICD-10-CM | POA: Insufficient documentation

## 2016-02-26 DIAGNOSIS — R079 Chest pain, unspecified: Secondary | ICD-10-CM | POA: Insufficient documentation

## 2016-02-26 DIAGNOSIS — I779 Disorder of arteries and arterioles, unspecified: Secondary | ICD-10-CM | POA: Insufficient documentation

## 2016-02-26 DIAGNOSIS — E119 Type 2 diabetes mellitus without complications: Secondary | ICD-10-CM | POA: Diagnosis not present

## 2016-02-26 DIAGNOSIS — Z01810 Encounter for preprocedural cardiovascular examination: Secondary | ICD-10-CM

## 2016-02-26 DIAGNOSIS — Z87891 Personal history of nicotine dependence: Secondary | ICD-10-CM | POA: Diagnosis not present

## 2016-02-26 DIAGNOSIS — I10 Essential (primary) hypertension: Secondary | ICD-10-CM | POA: Insufficient documentation

## 2016-02-26 DIAGNOSIS — R9439 Abnormal result of other cardiovascular function study: Secondary | ICD-10-CM | POA: Diagnosis not present

## 2016-02-26 LAB — MYOCARDIAL PERFUSION IMAGING
CHL CUP RESTING HR STRESS: 83 {beats}/min
CSEPPHR: 104 {beats}/min
LV sys vol: 26 mL
LVDIAVOL: 78 mL (ref 46–106)
SDS: 3
SRS: 3
SSS: 6
TID: 1.14

## 2016-02-26 MED ORDER — TECHNETIUM TC 99M TETROFOSMIN IV KIT
10.4000 | PACK | Freq: Once | INTRAVENOUS | Status: AC | PRN
  Administered 2016-02-26: 10.4 via INTRAVENOUS
  Filled 2016-02-26: qty 10

## 2016-02-26 MED ORDER — TECHNETIUM TC 99M TETROFOSMIN IV KIT
29.0000 | PACK | Freq: Once | INTRAVENOUS | Status: AC | PRN
  Administered 2016-02-26: 29 via INTRAVENOUS
  Filled 2016-02-26: qty 29

## 2016-02-26 MED ORDER — AMINOPHYLLINE 25 MG/ML IV SOLN
75.0000 mg | Freq: Once | INTRAVENOUS | Status: AC
  Administered 2016-02-26: 75 mg via INTRAVENOUS

## 2016-02-26 MED ORDER — REGADENOSON 0.4 MG/5ML IV SOLN
0.4000 mg | Freq: Once | INTRAVENOUS | Status: AC
  Administered 2016-02-26: 0.4 mg via INTRAVENOUS

## 2016-03-11 ENCOUNTER — Other Ambulatory Visit: Payer: 59

## 2016-03-13 ENCOUNTER — Ambulatory Visit
Admission: RE | Admit: 2016-03-13 | Discharge: 2016-03-13 | Disposition: A | Discharge status: Home / Self Care | Payer: 59 | Source: Ambulatory Visit | Attending: Surgery | Admitting: Surgery

## 2016-03-13 DIAGNOSIS — I70213 Atherosclerosis of native arteries of extremities with intermittent claudication, bilateral legs: Secondary | ICD-10-CM

## 2016-03-13 MED ORDER — IOPAMIDOL (ISOVUE-370) INJECTION 76%
80.0000 mL | Freq: Once | INTRAVENOUS | Status: AC | PRN
  Administered 2016-03-13: 80 mL via INTRAVENOUS

## 2016-03-17 ENCOUNTER — Ambulatory Visit: Payer: 59 | Admitting: Surgery

## 2016-03-20 ENCOUNTER — Encounter: Payer: Self-pay | Admitting: Surgery

## 2016-03-24 ENCOUNTER — Ambulatory Visit (INDEPENDENT_AMBULATORY_CARE_PROVIDER_SITE_OTHER): Payer: 59 | Admitting: Surgery

## 2016-03-24 ENCOUNTER — Other Ambulatory Visit: Payer: Self-pay

## 2016-03-24 ENCOUNTER — Telehealth: Payer: Self-pay | Admitting: *Deleted

## 2016-03-24 ENCOUNTER — Encounter: Payer: Self-pay | Admitting: Surgery

## 2016-03-24 VITALS — BP 129/77 | HR 88 | Resp 18 | Ht 63.0 in | Wt 164.2 lb

## 2016-03-24 DIAGNOSIS — I70213 Atherosclerosis of native arteries of extremities with intermittent claudication, bilateral legs: Secondary | ICD-10-CM

## 2016-03-24 NOTE — Telephone Encounter (Signed)
-----   Message from Lewayne BuntingBrian S Crenshaw, MD sent at 03/24/2016 11:35 AM EDT -----   ----- Message ----- From: Nada LibmanVance W Brabham, MD Sent: 03/24/2016   9:46 AM To: Lewayne BuntingBrian S Crenshaw, MD

## 2016-03-24 NOTE — Progress Notes (Signed)
Pt ok for surgery; DC brilinta as we are now one year since prior PCI Olga MillersBrian Crenshaw

## 2016-03-24 NOTE — Telephone Encounter (Signed)
Spoke with pt, Aware of dr crenshaw's recommendations.  °

## 2016-03-24 NOTE — Progress Notes (Signed)
Vascular and Vein Specialist of [place]  Patient name: Debra Dennis MRN: [id number] DOB: 11-24-1955 Sex: female  REASON FOR VISIT: follow up  HPI: Debra Dennis is a 60 y.o. female, who is referred today for evaluation of bilateral claudication.  The patient has recently undergone angiography by Dr. Allyson Sabal which revealed a 90% ostial calcified left common iliac lesion and an occluded right common and external iliac artery.  The patient had three-vessel runoff bilaterally.  She was felt to not be a good candidate for percutaneous revascularization and therefore was referred for surgical evaluation.  The patient reports that she gets symptoms after walking approximately 100 feet.  Her right leg bothers were the same as the left.  She states that her legs feel tight and feel like they're going to give out.  She denies ulcers or rest pain.  The patient has a significant coronary artery history.  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  She suffers from hypercholesterolemia which is managed with a statin.  She is on dual antiplatelet therapy.  She continues to be a half a pack a day smoker.  She has undergone ablation for trigeminal neuralgia.  The patient has an abdominal surgical history consisting of appendectomy and tubal ligation  She reports no changes since I last saw her.   No neurologic symptoms.  Claudication is stable.  No ulcers.  Past Medical History:  Diagnosis Date  . Bilateral carotid artery disease (HCC)   . CAD (coronary artery disease)   . Claudication (HCC)   . Diabetes mellitus (HCC)   . Hyperlipidemia   . Hypertension     Family History  Problem Relation Age of Onset  . CAD Son   . CAD Father     SOCIAL HISTORY: Social History  Substance Use Topics  . Smoking status: Current Some Day Smoker    Packs/day: 0.50    Types: Cigarettes  . Smokeless tobacco: Former Neurosurgeon  . Alcohol  use No    Allergies  Allergen Reactions  . Carbamazepine     Other reaction(s): Other Abnormal labs, liver enzymes were elevated  . Dilantin  [Phenytoin] Hives  . Other Other (See Comments)    Elevated liver function    Current Outpatient Prescriptions  Medication Sig Dispense Refill  . amLODipine (NORVASC) 5 MG tablet Take 5 mg by mouth daily.    Marland Kitchen aspirin EC 81 MG tablet Take 81 mg by mouth daily.    Marland Kitchen buPROPion (WELLBUTRIN SR) 150 MG 12 hr tablet Take 150 mg by mouth daily.    . Calcium Carbonate-Vitamin D (CALCIUM-VITAMIN D) 500-200 MG-UNIT tablet Take 1 tablet by mouth 2 (two) times daily.    . chlorthalidone (HYGROTON) 25 MG tablet Take 25 mg by mouth daily.    Tery Sanfilippo Sodium (COLACE PO) Take by mouth 2 (two) times daily.    . Loratadine (CLARITIN) 10 MG CAPS Take 10 mg by mouth daily.    . metFORMIN (GLUCOPHAGE) 500 MG tablet Take 500 mg by mouth 2 (two) times daily.    . nitroGLYCERIN (NITROSTAT) 0.4 MG SL tablet Place 1 tablet under the tongue every 5 (five) minutes as needed for chest pain.     . Omega-3 Fatty Acids (FISH OIL) 1000 MG CAPS Take 1 capsule by mouth 2 (two) times daily.    Marland Kitchen omeprazole (PRILOSEC) 20 MG capsule Take 20 mg by mouth 2 (two) times daily.    Marland Kitchen rOPINIRole (  REQUIP) 0.25 MG tablet Take 0.25 mg by mouth 3 (three) times daily as needed.     . rosuvastatin (CRESTOR) 40 MG tablet Take 1 tablet (40 mg total) by mouth daily. 90 tablet 3  . ticagrelor (BRILINTA) 90 MG TABS tablet Take 90 mg by mouth 2 (two) times daily.     No current facility-administered medications for this visit.     REVIEW OF SYSTEMS:  [X]  denotes positive finding, [ ]  denotes negative finding Cardiac  Comments:  Chest pain or chest pressure:    Shortness of breath upon exertion: x   Short of breath when lying flat:    Irregular heart rhythm: x       Vascular    Pain in calf, thigh, or hip brought on by ambulation: x   Pain in feet at night that wakes you up from your  sleep:     Blood clot in your veins:    Leg swelling:         Pulmonary    Oxygen at home:    Productive cough:     Wheezing:         Neurologic    Sudden weakness in arms or legs:     Sudden numbness in arms or legs:     Sudden onset of difficulty speaking or slurred speech:    Temporary loss of vision in one eye:     Problems with dizziness:         Gastrointestinal    Blood in stool:     Vomited blood:         Genitourinary    Burning when urinating:     Blood in urine:        Psychiatric    Major depression:         Hematologic    Bleeding problems:    Problems with blood clotting too easily:        Skin    Rashes or ulcers:        Constitutional    Fever or chills:      PHYSICAL EXAM: Vitals:   03/24/16 0846  BP: 129/77  Pulse: 88  Resp: 18  SpO2: 98%  Weight: 164 lb 3.2 oz (74.5 kg)  Height: 5\' 3"  (1.6 m)    GENERAL: The patient is a well-nourished female, in no acute distress. The vital signs are documented above. CARDIAC: There is a regular rate and rhythm.  VASCULAR: left carotid bruit.  Non-palpable pedal pulses PULMONARY: There is good air exchange bilaterally without wheezing or rales. ABDOMEN: Soft and non-tender with normal pitched bowel sounds.  MUSCULOSKELETAL: There are no major deformities or cyanosis. NEUROLOGIC: No focal weakness or paresthesias are detected. SKIN: There are no ulcers or rashes noted. PSYCHIATRIC: The patient has a normal affect.  DATA:  I have reviewed ehr CTA with the following findings 1. 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 2. Mild eccentric nonocclusive plaque in bilateral common femoral arteries. 3. Bilateral adrenal nodules, statistically most likely nonfunctional adrenal adenomas in the absence of a history of primary carcinoma.  MEDICAL ISSUES:  aortoiliac occlusive disease: I discussed with the  patient and her son proceeding with an aortobifemoral bypass graft. We described the details of the procedure as well as the expected hospital stay.  I discussed the potential complications including but not limited to cardiopulmonary complications, intestinal ischemia, incisional hernia, lower extremity  embolization, renal dysfunction.  She understands this and wishes to proceed. I will get formal clearance from Dr. Jens Som. She will need to be off of her Brilinta for 1 weeks.    Durene Cal, MD Vascular and Vein Specialists of [facility] [phone] Pager [phone]

## 2016-03-24 NOTE — Telephone Encounter (Signed)
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

## 2016-03-24 NOTE — Telephone Encounter (Signed)
Returned call as I got notice that she works nights and was going to bed soon.  Called patient to relay clearance. She stated concern since she is not quite at 1 year mark for Brilinta. Advised 5-7 days hold prior to surgery.  Patient aware [facility] will [facility] to [facility] for any further advice. She will be up for a little while - OK to leave msg.

## 2016-03-24 NOTE — Telephone Encounter (Signed)
Pt ok for surgery; DC brilinta as we are now [facility]  Left message for pt to call

## 2016-04-07 NOTE — Progress Notes (Signed)
HPI: FU coronary artery disease. Patient had a non-ST elevation myocardial infarction in September 2016. Cardiac catheterization at that time revealed normal left main, 40-50% mid LAD, 10-20% proximal circumflex, 99% second obtuse marginal and 40-50% mid RCA. There was spasm of the RCA noted. Patient had PCI of the obtuse marginal with drug-eluting stent. Echocardiogram showed normal overall LV function, mild aortic/mitral/tricuspid regurgitation. Patient had a monitor performed in December 2016 secondary to presyncope. Based on report it showed sinus rhythm with periods of sinus tachycardia and sinus bradycardia. There were sinus pauses noted during sleeping hours. Her beta blocker was discontinued. Strips not available. Carotid Dopplers March 2017 showed 60-79% left and 40-59% right stenosis. Lower extremity arteriogram May 2017 showed severe disease in the distal aorta as well as an occluded right common iliac and high-grade left common iliac stenosis. Nuclear study August 2017 showed ejection fraction 66%. There was shifting breast attenuation but no ischemia. Aortobifemoral bypass grafting is planned by Dr.  Myra Gianotti. Since last seen, she denies dyspnea, chest pain, palpitations or syncope. She continues with claudication.  Current Outpatient Prescriptions  Medication Sig Dispense Refill  . amLODipine (NORVASC) 5 MG tablet Take 5 mg by mouth daily.    . Armodafinil 250 MG tablet Take 250 mg by mouth daily as needed. For night shift.    Marland Kitchen artificial tears (LACRILUBE) OINT ophthalmic ointment Place 1 application into the left eye at bedtime as needed (for dry/red/irritated eyes.).    Marland Kitchen aspirin EC 81 MG tablet Take 81 mg by mouth daily.    Marland Kitchen buPROPion (WELLBUTRIN SR) 150 MG 12 hr tablet Take 150 mg by mouth daily.    . Calcium Carbonate-Vitamin D (CALCIUM-VITAMIN D) 500-200 MG-UNIT tablet Take 1 tablet by mouth 2 (two) times daily.    Tery Sanfilippo Sodium (COLACE PO) Take 100 mg by mouth 2 (two)  times daily.     . hydroxypropyl methylcellulose / hypromellose (ISOPTO TEARS / GONIOVISC) 2.5 % ophthalmic solution Place 1 drop into the left eye 3 (three) times daily as needed for dry eyes.    . Loratadine (CLARITIN) 10 MG CAPS Take 10 mg by mouth daily.    . metFORMIN (GLUCOPHAGE) 500 MG tablet Take 500 mg by mouth 2 (two) times daily.    . Multiple Vitamin (MULTIVITAMIN WITH MINERALS) TABS tablet Take 1 tablet by mouth daily.    . nitroGLYCERIN (NITROSTAT) 0.4 MG SL tablet Place 1 tablet under the tongue every 5 (five) minutes as needed for chest pain.     . Omega-3 Fatty Acids (FISH OIL) 1000 MG CAPS Take 1 capsule by mouth 2 (two) times daily.    Marland Kitchen omeprazole (PRILOSEC) 20 MG capsule Take 20 mg by mouth 2 (two) times daily.    Marland Kitchen rOPINIRole (REQUIP) 0.25 MG tablet Take 0.25 mg by mouth 3 (three) times daily as needed. For leg cramps/restless legs.    . rosuvastatin (CRESTOR) 40 MG tablet Take 1 tablet (40 mg total) by mouth daily. 90 tablet 3   No current facility-administered medications for this visit.      Past Medical History:  Diagnosis Date  . Bilateral carotid artery disease (HCC)   . CAD (coronary artery disease)   . Claudication (HCC)   . Diabetes mellitus (HCC)   . Hyperlipidemia   . Hypertension     Past Surgical History:  Procedure Laterality Date  . APPENDECTOMY    . HEEL SPUR RESECTION    . PERIPHERAL VASCULAR CATHETERIZATION Bilateral 11/05/2015  Procedure: Lower Extremity Angiography;  Surgeon: Runell GessJonathan J Berry, MD;  [facility];  Service: Cardiovascular;  Laterality: Bilateral;  . PERIPHERAL VASCULAR CATHETERIZATION N/A 11/05/2015   Procedure: Abdominal Aortogram;  Surgeon: Runell GessJonathan J Berry, MD;  [facility];  Service: Cardiovascular;  Laterality: N/A;    Social History   Social History  . Marital status: Widowed    Spouse name: N/A  . Number of children: 2  . Years of education: N/A   Occupational History  . Not on  file.   Social History Main Topics  . Smoking status: Current Some Day Smoker    Packs/day: 0.50    Types: Cigarettes  . Smokeless tobacco: Former NeurosurgeonUser  . Alcohol use No  . Drug use: Unknown  . Sexual activity: Not on file   Other Topics Concern  . Not on file   Social History Narrative  . No narrative on file    Family History  Problem Relation Age of Onset  . CAD Son   . CAD Father     ROS: Claudication but no fevers or chills, productive cough, hemoptysis, dysphasia, odynophagia, melena, hematochezia, dysuria, hematuria, rash, seizure activity, orthopnea, PND, pedal edema, claudication. Remaining systems are negative.  Physical Exam: Well-developed well-nourished in no acute distress.  Skin is warm and dry.  HEENT is normal.  Neck is supple.  Chest is clear to auscultation with normal expansion.  Cardiovascular exam is regular rate and rhythm.  Abdominal exam nontender or distended. No masses palpated. Extremities show no edema. neuro grossly intact  ECG-sinus rhythm at a rate of 93. Septal infarct. Occasional PVC.  1 coronary artery disease-continue aspirin. Continue statin. Discontinue brilinta.  2 peripheral vascular disease-continue aspirin and statin. She is scheduled for surgical intervention.  3 carotid artery disease-continue aspirin and statin. Follow-up carotid Dopplers March 2018.  4 hyperlipidemia-continue Crestor.  5 tobacco abuse-patient counseled on discontinuing.  6 hypertension-blood pressure controlled. Continue present medications.  7 preoperative evaluation prior to peripheral vascular surgery-no recent chest pain. Patient may proceed with surgery without further cardiac evaluation.  Olga MillersBrian Fletcher Ostermiller, MD

## 2016-04-10 NOTE — Pre-Procedure Instructions (Signed)
Debra HickBarbara Dennis  04/10/2016     Your procedure is scheduled on : Friday April 18, 2016 at 7:30 AM.  [facility] 5:30 AM.  Call this number if you have problems the morning of surgery: [phone]    Remember:  Do not eat food or drink liquids after midnight.  Take these medicines the morning of surgery with A SIP OF WATER : Amlodipine (Norvasc), Bupropion (Wellbutrin), Loratadine (Claritin), Omeprazole (Prilosec), Requip if needed   Stop taking any vitamins, herbal medications/supplements, NSAIDs, Ibuprofen, Advil, Motrin, Aleve, etc today   Do NOT take any diabetic pills the morning of your surgery (NO Metformin/Glucophage)    How to Manage Your Diabetes Before and After Surgery  Why is it important to control my blood sugar before and after surgery? . Improving blood sugar levels before and after surgery helps healing and can limit problems. . A way of improving blood sugar control is eating a healthy diet by: o  Eating less sugar and carbohydrates o  Increasing activity/exercise o  Talking with your doctor about reaching your blood sugar goals . High blood sugars (greater than 180 mg/dL) can raise your risk of infections and slow your recovery, so you will need to focus on controlling your diabetes during the weeks before surgery. . Make sure that the doctor who takes care of your diabetes knows about your planned surgery including the date and location.  How do I manage my blood sugar before surgery? . Check your blood sugar at least 4 times a day, starting 2 days before surgery, to make sure that the level is not too high or low. o Check your blood sugar the morning of your surgery when you wake up and every 2 hours until you get to the Short Stay unit. . If your blood sugar is less than 70 mg/dL, you will need to treat for low blood sugar: o Do not take insulin. o Treat a low blood sugar (less than 70 mg/dL) with  cup of clear juice  (cranberry or apple), 4 glucose tablets, OR glucose gel. o Recheck blood sugar in 15 minutes after treatment (to make sure it is greater than 70 mg/dL). If your blood sugar is not greater than 70 mg/dL on recheck, call [phone] for further instructions. . Report your blood sugar to the short stay nurse when you get to Short Stay.  . If you are admitted to the hospital after surgery: o Your blood sugar will be checked by the staff and you will probably be given insulin after surgery (instead of oral diabetes medicines) to make sure you have good blood sugar levels. o The goal for blood sugar control after surgery is 80-180 mg/dL.     WHAT DO I DO ABOUT MY DIABETES MEDICATION?   Marland Kitchen. Do not take oral diabetes medicines (pills) the morning of surgery.  Reviewed and Endorsed by [facility] Patient Education Committee, August 2015   Do not wear jewelry, make-up or nail polish.  Do not wear lotions, powders, or perfumes, or deoderant.  Do not shave 48 hours prior to surgery.    Do not bring valuables to the hospital.  [facility] is not responsible for any belongings or valuables.  Contacts, dentures or bridgework may not be worn into surgery.  Leave your suitcase in the car.  After surgery it may be brought to your room.  For patients admitted to the hospital, discharge time will be determined by your treatment team.  Patients discharged the day of surgery will not be allowed to drive home.   Name and phone number of your driver:    Special instructions: Shower using CHG soap the night before and the morning of your surgery  Please read over the following fact sheets that you were given. Pain Booklet and MRSA Information

## 2016-04-11 ENCOUNTER — Encounter (HOSPITAL_COMMUNITY)
Admission: RE | Admit: 2016-04-11 | Discharge: 2016-04-11 | Disposition: A | Discharge status: Home / Self Care | Payer: 59 | Source: Ambulatory Visit | Attending: Surgery | Admitting: Surgery

## 2016-04-11 ENCOUNTER — Encounter: Payer: Self-pay | Admitting: Cardiology

## 2016-04-11 ENCOUNTER — Ambulatory Visit (INDEPENDENT_AMBULATORY_CARE_PROVIDER_SITE_OTHER): Payer: 59 | Admitting: Cardiology

## 2016-04-11 ENCOUNTER — Encounter (HOSPITAL_COMMUNITY): Payer: Self-pay

## 2016-04-11 ENCOUNTER — Encounter: Payer: Self-pay | Admitting: *Deleted

## 2016-04-11 VITALS — BP 132/78 | HR 93 | Ht 62.0 in | Wt 168.0 lb

## 2016-04-11 DIAGNOSIS — Z01812 Encounter for preprocedural laboratory examination: Secondary | ICD-10-CM | POA: Insufficient documentation

## 2016-04-11 DIAGNOSIS — E785 Hyperlipidemia, unspecified: Secondary | ICD-10-CM | POA: Insufficient documentation

## 2016-04-11 DIAGNOSIS — I2510 Atherosclerotic heart disease of native coronary artery without angina pectoris: Secondary | ICD-10-CM

## 2016-04-11 DIAGNOSIS — I739 Peripheral vascular disease, unspecified: Secondary | ICD-10-CM | POA: Diagnosis not present

## 2016-04-11 DIAGNOSIS — F17210 Nicotine dependence, cigarettes, uncomplicated: Secondary | ICD-10-CM | POA: Insufficient documentation

## 2016-04-11 DIAGNOSIS — E119 Type 2 diabetes mellitus without complications: Secondary | ICD-10-CM | POA: Diagnosis not present

## 2016-04-11 DIAGNOSIS — I10 Essential (primary) hypertension: Secondary | ICD-10-CM | POA: Diagnosis not present

## 2016-04-11 DIAGNOSIS — E7849 Other hyperlipidemia: Secondary | ICD-10-CM

## 2016-04-11 DIAGNOSIS — E784 Other hyperlipidemia: Secondary | ICD-10-CM

## 2016-04-11 DIAGNOSIS — K219 Gastro-esophageal reflux disease without esophagitis: Secondary | ICD-10-CM | POA: Insufficient documentation

## 2016-04-11 DIAGNOSIS — Z7984 Long term (current) use of oral hypoglycemic drugs: Secondary | ICD-10-CM | POA: Diagnosis not present

## 2016-04-11 HISTORY — DX: Transient cerebral ischemic attack, unspecified: G45.9

## 2016-04-11 HISTORY — DX: Restless legs syndrome: G25.81

## 2016-04-11 HISTORY — DX: Acute myocardial infarction, unspecified: I21.9

## 2016-04-11 HISTORY — DX: Gastro-esophageal reflux disease without esophagitis: K21.9

## 2016-04-11 HISTORY — DX: Zoster without complications: B02.9

## 2016-04-11 LAB — TYPE AND SCREEN
ABO/RH(D): O POS
ANTIBODY SCREEN: NEGATIVE

## 2016-04-11 LAB — URINALYSIS, ROUTINE W REFLEX MICROSCOPIC
BILIRUBIN URINE: NEGATIVE
GLUCOSE, UA: NEGATIVE mg/dL
HGB URINE DIPSTICK: NEGATIVE
KETONES UR: NEGATIVE mg/dL
Leukocytes, UA: NEGATIVE
Nitrite: NEGATIVE
PROTEIN: NEGATIVE mg/dL
Specific Gravity, Urine: 1.015 (ref 1.005–1.030)
pH: 5.5 (ref 5.0–8.0)

## 2016-04-11 LAB — COMPREHENSIVE METABOLIC PANEL
ALK PHOS: 75 U/L (ref 38–126)
ALT: 15 U/L (ref 14–54)
ANION GAP: 10 (ref 5–15)
AST: 23 U/L (ref 15–41)
Albumin: 3.4 g/dL — ABNORMAL LOW (ref 3.5–5.0)
BILIRUBIN TOTAL: 0.2 mg/dL — AB (ref 0.3–1.2)
BUN: 9 mg/dL (ref 6–20)
CO2: 25 mmol/L (ref 22–32)
CREATININE: 0.64 mg/dL (ref 0.44–1.00)
Calcium: 9.3 mg/dL (ref 8.9–10.3)
Chloride: 104 mmol/L (ref 101–111)
GFR calc Af Amer: >60 mL/min (ref >60)
Glucose, Bld: 164 mg/dL — ABNORMAL HIGH (ref 65–99)
POTASSIUM: 3.7 mmol/L (ref 3.5–5.1)
Sodium: 139 mmol/L (ref 135–145)
Total Protein: 6 g/dL — ABNORMAL LOW (ref 6.5–8.1)

## 2016-04-11 LAB — BLOOD GAS, ARTERIAL
Acid-Base Excess: 1.1 mmol/L (ref 0.0–2.0)
BICARBONATE: 25.1 mmol/L (ref 20.0–28.0)
Drawn by: 449841
FIO2: 0.21
O2 Saturation: 84 %
PCO2 ART: 38.9 mmHg (ref 32.0–48.0)
PH ART: 7.425 (ref 7.350–7.450)
PO2 ART: 48.5 mmHg — AB (ref 83.0–108.0)
Patient temperature: 98.6

## 2016-04-11 LAB — LIPID PANEL
Cholesterol: 143 mg/dL (ref 0–200)
HDL: 48 mg/dL (ref >40)
LDL CALC: 33 mg/dL (ref 0–99)
Total CHOL/HDL Ratio: 3 RATIO
Triglycerides: 309 mg/dL — ABNORMAL HIGH (ref <150)
VLDL: 62 mg/dL — ABNORMAL HIGH (ref 0–40)

## 2016-04-11 LAB — CBC
HEMATOCRIT: 35.9 % — AB (ref 36.0–46.0)
HEMOGLOBIN: 10.8 g/dL — AB (ref 12.0–15.0)
MCH: 24.3 pg — AB (ref 26.0–34.0)
MCHC: 30.1 g/dL (ref 30.0–36.0)
MCV: 80.7 fL (ref 78.0–100.0)
Platelets: 305 10*3/uL (ref 150–400)
RBC: 4.45 MIL/uL (ref 3.87–5.11)
RDW: 17.2 % — AB (ref 11.5–15.5)
WBC: 10.2 10*3/uL (ref 4.0–10.5)

## 2016-04-11 LAB — PROTIME-INR
INR: 0.93
PROTHROMBIN TIME: 12.4 s (ref 11.4–15.2)

## 2016-04-11 LAB — GLUCOSE, CAPILLARY: GLUCOSE-CAPILLARY: 165 mg/dL — AB (ref 65–99)

## 2016-04-11 LAB — SURGICAL PCR SCREEN
MRSA, PCR: NEGATIVE
STAPHYLOCOCCUS AUREUS: NEGATIVE

## 2016-04-11 LAB — ABO/RH: ABO/RH(D): O POS

## 2016-04-11 LAB — APTT: aPTT: 33 seconds (ref 24–36)

## 2016-04-11 MED ORDER — CHLORHEXIDINE GLUCONATE CLOTH 2 % EX PADS: 6.0000 | MEDICATED_PAD | Freq: Once | CUTANEOUS | Status: DC

## 2016-04-11 NOTE — Progress Notes (Signed)
PCP is Samuel Jesterynthia Butler  Cardiologist is Olga MillersBrian Crenshaw. LOV was today 04/11/16.  Patient denied having any acute cardiac or pulmonary issues  CBG on arrival to PAT was 165. Patient stated blood glucose typically ranges from 60s to 140s, and patient was unaware of last A1C.  Patient stated she was instructed to stop taking Brilinta since it has been a year since her heart attack.  Patient was accompanied to PAT by her daugther and granddaughter.

## 2016-04-11 NOTE — Patient Instructions (Signed)
Medication Instructions:   NO CHANGE  Labwork:  Your physician recommends that you return for lab work WITH OTHER LAB WORK TODAY  Follow-Up:  Your physician wants you to follow-up in: 6 MONTHS WITH DR Jens SomRENSHAW You will receive a reminder letter in the mail two months in advance. If you don't receive a letter, please call our office to schedule the follow-up appointment.   If you need a refill on your cardiac medications before your next appointment, please call your pharmacy.

## 2016-04-12 LAB — HEMOGLOBIN A1C
Hgb A1c MFr Bld: 7 % — ABNORMAL HIGH (ref 4.8–5.6)
MEAN PLASMA GLUCOSE: 154 mg/dL

## 2016-04-14 NOTE — Progress Notes (Signed)
Anesthesia Chart Review:  Pt is a 60 year old female scheduled for aortobifemoral bypass graft on 04/18/2016 with Coral ElseVance Brabham, MD.   - Cardiologist is Olga MillersBrian Crenshaw, MD who cleared pt for surgery at last office visit 04/11/16.   PMH includes:  CAD (MI, DES to 2nd obtuse marginal 03/2015), HTN, PVD, carotid artery disease, TIA, DM, hyperlipidemia, GERD. Current smoker. BMI 31  Medications include: amlodipine, metformin, prilosec, crestor  Preoperative labs reviewed.  HgbA1c 7.0, glucose 164.   CXR 10/30/15: The study is limited due to hypoinflation which is accentuates the conspicuity of the pulmonary vascularity and interstitial markings. There is no alveolar pneumonia nor pulmonary edema.  EKG 04/11/16: sinus rhythm with occasional PVCs. Low voltage QRS. Cannot rule out anterior infarct, age undetermined.   Nuclear stress test 02/26/16: Medium size, mild intensity partially reversible (SDS 3) apical anterior perfusion defect, suggestive of shifting breast attenuation. No significant reversible ischemia. LVEF 66% with normal wall motion. This is a low risk study.  Carotid duplex 10/03/15:  - 40-59% right ICA stenosis. - 60-79% left ICA stenosis  Cardiac event monitor 06/13/15 (care everywhere):  - Sinus rhythm with periods of sinus tachycardia and sinus bradycardia.Sinus pauses noted.These occurred during sleeping hours. - The results of this monitor triggered an emergency room visit with anelectrophysiology consultation as documented 05/20/2013.The patient's beta blocker has been discontinued and close follow-up was arranged.  Cardiac cath 04/02/15 (care everywhere):  1. Non-ST elevation myocardial infarction. 2. Single vessel obstructive coronary disease of second marginal branch of left circumflex  3. Successful percutaneous coronary intervention with drug-eluting stent-second marginal branch left circumflex humeral 4. Severe spasm of ostial-proximal right coronary artery-with  mild/moderate ostial-proximal plaque  Echo 03/31/15 (care everywhere): - The study was technically adequate. There is no comparison study available.  - The aortic valve is trileaflet with thin, pliable leaflets that move normally. - The tricupid valve leaflets are structurally normal with mild (1+) regurgitation. - There is mild (1+) mitral regurgitation. - The left ventricular diastolic function is abnormal. - There is mild [1+] aortic regurgitation present. - Dilated inferior vena cava suggests increased right atrial pressure. - The left ventricle is normal in size, wall thickness and wall motion with ejection fraction of 60-65%.  If no changes, I anticipate pt can proceed with surgery as scheduled.   Rica Mastngela Solei Wubben, FNP-BC [facility]/Anesthesiology Phone: [phone] 04/14/2016 2:09 PM

## 2016-04-18 ENCOUNTER — Inpatient Hospital Stay (HOSPITAL_COMMUNITY): Payer: 59 | Admitting: Emergency Medicine

## 2016-04-18 ENCOUNTER — Encounter (HOSPITAL_COMMUNITY): Payer: Self-pay | Admitting: *Deleted

## 2016-04-18 ENCOUNTER — Encounter (HOSPITAL_COMMUNITY)
Admission: RE | Disposition: A | Discharge status: Home / Self Care | Payer: Self-pay | Source: Ambulatory Visit | Attending: Surgery

## 2016-04-18 ENCOUNTER — Inpatient Hospital Stay (HOSPITAL_COMMUNITY)
Admission: RE | Admit: 2016-04-18 | Discharge: 2016-04-23 | DRG: 269 | Disposition: A | Discharge status: Home / Self Care | Payer: 59 | Source: Ambulatory Visit | Attending: Surgery | Admitting: Surgery

## 2016-04-18 ENCOUNTER — Inpatient Hospital Stay (HOSPITAL_COMMUNITY): Payer: 59 | Admitting: Certified Registered Nurse Anesthetist

## 2016-04-18 ENCOUNTER — Inpatient Hospital Stay (HOSPITAL_COMMUNITY): Payer: 59

## 2016-04-18 DIAGNOSIS — I2510 Atherosclerotic heart disease of native coronary artery without angina pectoris: Secondary | ICD-10-CM | POA: Diagnosis present

## 2016-04-18 DIAGNOSIS — R262 Difficulty in walking, not elsewhere classified: Secondary | ICD-10-CM

## 2016-04-18 DIAGNOSIS — R0602 Shortness of breath: Secondary | ICD-10-CM

## 2016-04-18 DIAGNOSIS — Z7982 Long term (current) use of aspirin: Secondary | ICD-10-CM

## 2016-04-18 DIAGNOSIS — I70213 Atherosclerosis of native arteries of extremities with intermittent claudication, bilateral legs: Secondary | ICD-10-CM | POA: Diagnosis not present

## 2016-04-18 DIAGNOSIS — I252 Old myocardial infarction: Secondary | ICD-10-CM

## 2016-04-18 DIAGNOSIS — E7800 Pure hypercholesterolemia, unspecified: Secondary | ICD-10-CM | POA: Diagnosis present

## 2016-04-18 DIAGNOSIS — F17210 Nicotine dependence, cigarettes, uncomplicated: Secondary | ICD-10-CM | POA: Diagnosis present

## 2016-04-18 DIAGNOSIS — Z888 Allergy status to other drugs, medicaments and biological substances status: Secondary | ICD-10-CM

## 2016-04-18 DIAGNOSIS — Z7984 Long term (current) use of oral hypoglycemic drugs: Secondary | ICD-10-CM

## 2016-04-18 DIAGNOSIS — I739 Peripheral vascular disease, unspecified: Secondary | ICD-10-CM | POA: Diagnosis present

## 2016-04-18 DIAGNOSIS — Z95828 Presence of other vascular implants and grafts: Secondary | ICD-10-CM

## 2016-04-18 DIAGNOSIS — Z8249 Family history of ischemic heart disease and other diseases of the circulatory system: Secondary | ICD-10-CM

## 2016-04-18 DIAGNOSIS — E1159 Type 2 diabetes mellitus with other circulatory complications: Secondary | ICD-10-CM | POA: Diagnosis present

## 2016-04-18 DIAGNOSIS — Z79899 Other long term (current) drug therapy: Secondary | ICD-10-CM | POA: Diagnosis not present

## 2016-04-18 DIAGNOSIS — Z8673 Personal history of transient ischemic attack (TIA), and cerebral infarction without residual deficits: Secondary | ICD-10-CM | POA: Diagnosis not present

## 2016-04-18 DIAGNOSIS — I7409 Other arterial embolism and thrombosis of abdominal aorta: Secondary | ICD-10-CM | POA: Diagnosis present

## 2016-04-18 DIAGNOSIS — I119 Hypertensive heart disease without heart failure: Secondary | ICD-10-CM | POA: Diagnosis present

## 2016-04-18 HISTORY — PX: AORTA - BILATERAL FEMORAL ARTERY BYPASS GRAFT: SHX1175

## 2016-04-18 LAB — POCT I-STAT 7, (LYTES, BLD GAS, ICA,H+H)
BICARBONATE: 26.2 mmol/L (ref 20.0–28.0)
CALCIUM ION: 1.17 mmol/L (ref 1.15–1.40)
HEMATOCRIT: 27 % — AB (ref 36.0–46.0)
Hemoglobin: 9.2 g/dL — ABNORMAL LOW (ref 12.0–15.0)
O2 SAT: 98 %
PH ART: 7.33 — AB (ref 7.350–7.450)
POTASSIUM: 4.1 mmol/L (ref 3.5–5.1)
Patient temperature: 36.2
SODIUM: 140 mmol/L (ref 135–145)
TCO2: 28 mmol/L (ref 0–100)
pCO2 arterial: 49.2 mmHg — ABNORMAL HIGH (ref 32.0–48.0)
pO2, Arterial: 111 mmHg — ABNORMAL HIGH (ref 83.0–108.0)

## 2016-04-18 LAB — BLOOD GAS, ARTERIAL
ACID-BASE DEFICIT: 0.8 mmol/L (ref 0.0–2.0)
Bicarbonate: 24.7 mmol/L (ref 20.0–28.0)
O2 Content: 3 L/min
O2 SAT: 88.3 %
PATIENT TEMPERATURE: 97.5
PCO2 ART: 49.1 mmHg — AB (ref 32.0–48.0)
pH, Arterial: 7.318 — ABNORMAL LOW (ref 7.350–7.450)
pO2, Arterial: 61.9 mmHg — ABNORMAL LOW (ref 83.0–108.0)

## 2016-04-18 LAB — CBC
HCT: 31 % — ABNORMAL LOW (ref 36.0–46.0)
Hemoglobin: 9.3 g/dL — ABNORMAL LOW (ref 12.0–15.0)
MCH: 23.9 pg — AB (ref 26.0–34.0)
MCHC: 30 g/dL (ref 30.0–36.0)
MCV: 79.7 fL (ref 78.0–100.0)
PLATELETS: 232 10*3/uL (ref 150–400)
RBC: 3.89 MIL/uL (ref 3.87–5.11)
RDW: 16.9 % — ABNORMAL HIGH (ref 11.5–15.5)
WBC: 18.3 10*3/uL — AB (ref 4.0–10.5)

## 2016-04-18 LAB — GLUCOSE, CAPILLARY
GLUCOSE-CAPILLARY: 169 mg/dL — AB (ref 65–99)
Glucose-Capillary: 154 mg/dL — ABNORMAL HIGH (ref 65–99)
Glucose-Capillary: 201 mg/dL — ABNORMAL HIGH (ref 65–99)
Glucose-Capillary: 225 mg/dL — ABNORMAL HIGH (ref 65–99)
Glucose-Capillary: 253 mg/dL — ABNORMAL HIGH (ref 65–99)

## 2016-04-18 LAB — BASIC METABOLIC PANEL
ANION GAP: 8 (ref 5–15)
BUN: 6 mg/dL (ref 6–20)
CO2: 25 mmol/L (ref 22–32)
Calcium: 8.4 mg/dL — ABNORMAL LOW (ref 8.9–10.3)
Chloride: 105 mmol/L (ref 101–111)
Creatinine, Ser: 0.55 mg/dL (ref 0.44–1.00)
Glucose, Bld: 251 mg/dL — ABNORMAL HIGH (ref 65–99)
POTASSIUM: 4 mmol/L (ref 3.5–5.1)
SODIUM: 138 mmol/L (ref 135–145)

## 2016-04-18 LAB — APTT: aPTT: 30 seconds (ref 24–36)

## 2016-04-18 LAB — MAGNESIUM: MAGNESIUM: 1.5 mg/dL — AB (ref 1.7–2.4)

## 2016-04-18 SURGERY — CREATION, BYPASS, ARTERIAL, AORTA TO FEMORAL, BILATERAL, USING GRAFT
Anesthesia: General | Laterality: Bilateral

## 2016-04-18 MED ORDER — ASPIRIN EC 81 MG PO TBEC
81.0000 mg | DELAYED_RELEASE_TABLET | Freq: Every day | ORAL | Status: DC
  Administered 2016-04-18 – 2016-04-23 (×5): 81 mg via ORAL
  Filled 2016-04-18 (×6): qty 1

## 2016-04-18 MED ORDER — DOCUSATE SODIUM 100 MG PO CAPS
100.0000 mg | ORAL_CAPSULE | Freq: Every day | ORAL | Status: DC
  Administered 2016-04-20 – 2016-04-22 (×3): 100 mg via ORAL
  Filled 2016-04-18 (×5): qty 1

## 2016-04-18 MED ORDER — LACTATED RINGERS IV SOLN
INTRAVENOUS | Status: DC | PRN
  Administered 2016-04-18 (×2): via INTRAVENOUS

## 2016-04-18 MED ORDER — HYPROMELLOSE (GONIOSCOPIC) 2.5 % OP SOLN
1.0000 [drp] | Freq: Three times a day (TID) | OPHTHALMIC | Status: DC | PRN
  Filled 2016-04-18: qty 15

## 2016-04-18 MED ORDER — ROCURONIUM BROMIDE 100 MG/10ML IV SOLN
INTRAVENOUS | Status: DC | PRN
  Administered 2016-04-18: 60 mg via INTRAVENOUS
  Administered 2016-04-18: 40 mg via INTRAVENOUS
  Administered 2016-04-18: 30 mg via INTRAVENOUS

## 2016-04-18 MED ORDER — ALBUMIN HUMAN 5 % IV SOLN
INTRAVENOUS | Status: DC | PRN
  Administered 2016-04-18 (×2): via INTRAVENOUS

## 2016-04-18 MED ORDER — HYDROMORPHONE HCL 1 MG/ML IJ SOLN
INTRAMUSCULAR | Status: AC
  Filled 2016-04-18: qty 1

## 2016-04-18 MED ORDER — HYDROMORPHONE HCL 1 MG/ML IJ SOLN
0.2500 mg | INTRAMUSCULAR | Status: DC | PRN
  Administered 2016-04-18: 0.5 mg via INTRAVENOUS
  Administered 2016-04-18: 0.25 mg via INTRAVENOUS
  Administered 2016-04-18 (×2): 0.5 mg via INTRAVENOUS

## 2016-04-18 MED ORDER — PROPOFOL 10 MG/ML IV BOLUS
INTRAVENOUS | Status: AC
  Filled 2016-04-18: qty 20

## 2016-04-18 MED ORDER — DEXTROSE 5 % IV SOLN
1.5000 g | Freq: Two times a day (BID) | INTRAVENOUS | Status: AC
  Administered 2016-04-18 – 2016-04-19 (×2): 1.5 g via INTRAVENOUS
  Filled 2016-04-18 (×3): qty 1.5

## 2016-04-18 MED ORDER — ACETAMINOPHEN 325 MG RE SUPP: 325.0000 mg | RECTAL | Status: DC | PRN

## 2016-04-18 MED ORDER — MAGNESIUM SULFATE 2 GM/50ML IV SOLN
2.0000 g | Freq: Every day | INTRAVENOUS | Status: AC | PRN
  Administered 2016-04-19: 2 g via INTRAVENOUS
  Filled 2016-04-18: qty 50

## 2016-04-18 MED ORDER — ONDANSETRON HCL 4 MG/2ML IJ SOLN
INTRAMUSCULAR | Status: DC | PRN
  Administered 2016-04-18: 4 mg via INTRAVENOUS

## 2016-04-18 MED ORDER — ALUM & MAG HYDROXIDE-SIMETH 200-200-20 MG/5ML PO SUSP
15.0000 mL | ORAL | Status: DC | PRN
Start: 2016-04-18 — End: 2016-04-23

## 2016-04-18 MED ORDER — METOPROLOL TARTRATE 5 MG/5ML IV SOLN: 2.0000 mg | INTRAVENOUS | Status: DC | PRN

## 2016-04-18 MED ORDER — PHENYLEPHRINE HCL 10 MG/ML IJ SOLN
INTRAMUSCULAR | Status: DC | PRN
  Administered 2016-04-18: 25 ug/min via INTRAVENOUS

## 2016-04-18 MED ORDER — FENTANYL CITRATE (PF) 100 MCG/2ML IJ SOLN
INTRAMUSCULAR | Status: AC
  Filled 2016-04-18: qty 4

## 2016-04-18 MED ORDER — SODIUM CHLORIDE 0.9 % IV SOLN
INTRAVENOUS | Status: DC | PRN
  Administered 2016-04-18: 10:00:00

## 2016-04-18 MED ORDER — HEPARIN SODIUM (PORCINE) 1000 UNIT/ML IJ SOLN
INTRAMUSCULAR | Status: DC | PRN
  Administered 2016-04-18: 7000 [IU] via INTRAVENOUS

## 2016-04-18 MED ORDER — POTASSIUM CHLORIDE CRYS ER 20 MEQ PO TBCR: 20.0000 meq | EXTENDED_RELEASE_TABLET | Freq: Every day | ORAL | Status: DC | PRN

## 2016-04-18 MED ORDER — BUPROPION HCL ER (SR) 150 MG PO TB12
150.0000 mg | ORAL_TABLET | Freq: Every day | ORAL | Status: DC
  Administered 2016-04-18 – 2016-04-22 (×5): 150 mg via ORAL
  Filled 2016-04-18 (×4): qty 1

## 2016-04-18 MED ORDER — FENTANYL CITRATE (PF) 100 MCG/2ML IJ SOLN
INTRAMUSCULAR | Status: AC
Start: 2016-04-18 — End: 2016-04-18
  Filled 2016-04-18: qty 4

## 2016-04-18 MED ORDER — ARTIFICIAL TEARS OP OINT: 1.0000 "application " | TOPICAL_OINTMENT | Freq: Every evening | OPHTHALMIC | Status: DC | PRN

## 2016-04-18 MED ORDER — HYDRALAZINE HCL 20 MG/ML IJ SOLN: 5.0000 mg | INTRAMUSCULAR | Status: DC | PRN

## 2016-04-18 MED ORDER — DEXTROSE 5 % IV SOLN
1.5000 g | INTRAVENOUS | Status: AC
  Filled 2016-04-18: qty 1.5

## 2016-04-18 MED ORDER — MIDAZOLAM HCL 5 MG/5ML IJ SOLN
INTRAMUSCULAR | Status: DC | PRN
  Administered 2016-04-18 (×2): 1 mg via INTRAVENOUS
  Administered 2016-04-18: 60 mg via INTRAVENOUS

## 2016-04-18 MED ORDER — SODIUM CHLORIDE 0.9 % IV SOLN: 500.0000 mL | Freq: Once | INTRAVENOUS | Status: DC | PRN

## 2016-04-18 MED ORDER — HEMOSTATIC AGENTS (NO CHARGE) OPTIME
TOPICAL | Status: DC | PRN
  Administered 2016-04-18 (×2): 1 via TOPICAL

## 2016-04-18 MED ORDER — ONDANSETRON HCL 4 MG/2ML IJ SOLN: 4.0000 mg | Freq: Four times a day (QID) | INTRAMUSCULAR | Status: DC | PRN

## 2016-04-18 MED ORDER — PHENYLEPHRINE HCL 10 MG/ML IJ SOLN
INTRAMUSCULAR | Status: DC | PRN
  Administered 2016-04-18: 80 ug via INTRAVENOUS

## 2016-04-18 MED ORDER — FAMOTIDINE IN NACL 20-0.9 MG/50ML-% IV SOLN
20.0000 mg | Freq: Two times a day (BID) | INTRAVENOUS | Status: DC
  Administered 2016-04-18 – 2016-04-21 (×8): 20 mg via INTRAVENOUS
  Filled 2016-04-18 (×10): qty 50

## 2016-04-18 MED ORDER — ACETAMINOPHEN 325 MG PO TABS: 325.0000 mg | ORAL_TABLET | ORAL | Status: DC | PRN

## 2016-04-18 MED ORDER — GUAIFENESIN-DM 100-10 MG/5ML PO SYRP: 15.0000 mL | ORAL_SOLUTION | ORAL | Status: DC | PRN

## 2016-04-18 MED ORDER — HYDROMORPHONE HCL 1 MG/ML IJ SOLN
0.5000 mg | INTRAMUSCULAR | Status: DC | PRN
  Administered 2016-04-18 – 2016-04-20 (×7): 1 mg via INTRAVENOUS
  Administered 2016-04-20: 0.5 mg via INTRAVENOUS
  Administered 2016-04-20 – 2016-04-21 (×2): 1 mg via INTRAVENOUS
  Filled 2016-04-18 (×10): qty 1

## 2016-04-18 MED ORDER — DEXTROSE 5 % IV SOLN
1.5000 g | INTRAVENOUS | Status: AC
  Administered 2016-04-18 (×2): 1.5 g via INTRAVENOUS
  Filled 2016-04-18: qty 1.5

## 2016-04-18 MED ORDER — MIDAZOLAM HCL 2 MG/2ML IJ SOLN
INTRAMUSCULAR | Status: AC
  Filled 2016-04-18: qty 2

## 2016-04-18 MED ORDER — EPHEDRINE SULFATE 50 MG/ML IJ SOLN
INTRAMUSCULAR | Status: DC | PRN
  Administered 2016-04-18: 5 mg via INTRAVENOUS

## 2016-04-18 MED ORDER — PROTAMINE SULFATE 10 MG/ML IV SOLN
INTRAVENOUS | Status: DC | PRN
  Administered 2016-04-18: 50 mg via INTRAVENOUS

## 2016-04-18 MED ORDER — BUPROPION HCL ER (SR) 150 MG PO TB12: 150.0000 mg | ORAL_TABLET | Freq: Every day | ORAL | Status: DC

## 2016-04-18 MED ORDER — LABETALOL HCL 5 MG/ML IV SOLN: 10.0000 mg | INTRAVENOUS | Status: DC | PRN

## 2016-04-18 MED ORDER — FENTANYL CITRATE (PF) 100 MCG/2ML IJ SOLN
INTRAMUSCULAR | Status: DC | PRN
  Administered 2016-04-18 (×4): 50 ug via INTRAVENOUS
  Administered 2016-04-18: 100 ug via INTRAVENOUS
  Administered 2016-04-18 (×3): 50 ug via INTRAVENOUS

## 2016-04-18 MED ORDER — 0.9 % SODIUM CHLORIDE (POUR BTL) OPTIME
TOPICAL | Status: DC | PRN
  Administered 2016-04-18: 3000 mL

## 2016-04-18 MED ORDER — SODIUM CHLORIDE 0.9 % IV SOLN: INTRAVENOUS | Status: DC

## 2016-04-18 MED ORDER — SUGAMMADEX SODIUM 200 MG/2ML IV SOLN
INTRAVENOUS | Status: DC | PRN
  Administered 2016-04-18: 200 mg via INTRAVENOUS

## 2016-04-18 MED ORDER — ESMOLOL HCL 100 MG/10ML IV SOLN
INTRAVENOUS | Status: DC | PRN
  Administered 2016-04-18: 20 mg via INTRAVENOUS

## 2016-04-18 MED ORDER — PHENOL 1.4 % MT LIQD: 1.0000 | OROMUCOSAL | Status: DC | PRN

## 2016-04-18 MED ORDER — LACTATED RINGERS IV SOLN
INTRAVENOUS | Status: DC | PRN
  Administered 2016-04-18 (×2): via INTRAVENOUS

## 2016-04-18 MED ORDER — INSULIN ASPART 100 UNIT/ML ~~LOC~~ SOLN
0.0000 [IU] | SUBCUTANEOUS | Status: DC
  Administered 2016-04-18 (×2): 5 [IU] via SUBCUTANEOUS
  Administered 2016-04-19: 3 [IU] via SUBCUTANEOUS
  Administered 2016-04-19: 2 [IU] via SUBCUTANEOUS
  Administered 2016-04-19 (×2): 3 [IU] via SUBCUTANEOUS
  Administered 2016-04-19: 2 [IU] via SUBCUTANEOUS
  Administered 2016-04-19 – 2016-04-20 (×3): 3 [IU] via SUBCUTANEOUS
  Administered 2016-04-20: 2 [IU] via SUBCUTANEOUS
  Administered 2016-04-20: 3 [IU] via SUBCUTANEOUS
  Administered 2016-04-21 (×2): 2 [IU] via SUBCUTANEOUS
  Administered 2016-04-21 (×3): 3 [IU] via SUBCUTANEOUS
  Administered 2016-04-21 – 2016-04-22 (×2): 2 [IU] via SUBCUTANEOUS
  Administered 2016-04-22: 3 [IU] via SUBCUTANEOUS
  Administered 2016-04-22 – 2016-04-23 (×2): 2 [IU] via SUBCUTANEOUS

## 2016-04-18 MED ORDER — PROMETHAZINE HCL 25 MG/ML IJ SOLN: 6.2500 mg | INTRAMUSCULAR | Status: DC | PRN

## 2016-04-18 MED ORDER — KCL IN DEXTROSE-NACL 20-5-0.45 MEQ/L-%-% IV SOLN
INTRAVENOUS | Status: DC
  Administered 2016-04-18: 19:00:00 via INTRAVENOUS
  Administered 2016-04-19: 100 mL/h via INTRAVENOUS
  Administered 2016-04-20 (×2): via INTRAVENOUS
  Filled 2016-04-18 (×8): qty 1000

## 2016-04-18 SURGICAL SUPPLY — 63 items
APPLICATOR COTTON TIP 6IN STRL (MISCELLANEOUS) ×3 IMPLANT
CANISTER SUCTION 2500CC (MISCELLANEOUS) ×3 IMPLANT
CLIP TI MEDIUM 24 (CLIP) ×3 IMPLANT
CLIP TI WIDE RED SMALL 24 (CLIP) ×3 IMPLANT
COVER MAYO STAND STRL (DRAPES) ×3 IMPLANT
DERMABOND ADVANCED (GAUZE/BANDAGES/DRESSINGS) ×4
DERMABOND ADVANCED .7 DNX12 (GAUZE/BANDAGES/DRESSINGS) ×2 IMPLANT
ELECT BLADE 4.0 EZ CLEAN MEGAD (MISCELLANEOUS) ×3
ELECT BLADE 6.5 EXT (BLADE) IMPLANT
ELECT CAUTERY BLADE 6.4 (BLADE) ×3 IMPLANT
ELECT REM PT RETURN 9FT ADLT (ELECTROSURGICAL) ×3
ELECTRODE BLDE 4.0 EZ CLN MEGD (MISCELLANEOUS) ×1 IMPLANT
ELECTRODE REM PT RTRN 9FT ADLT (ELECTROSURGICAL) ×1 IMPLANT
FELT TEFLON 1X6 (MISCELLANEOUS) ×3 IMPLANT
GLOVE BIO SURGEON STRL SZ 6.5 (GLOVE) ×8 IMPLANT
GLOVE BIO SURGEON STRL SZ7.5 (GLOVE) ×3 IMPLANT
GLOVE BIO SURGEONS STRL SZ 6.5 (GLOVE) ×4
GLOVE BIOGEL PI IND STRL 6.5 (GLOVE) ×6 IMPLANT
GLOVE BIOGEL PI IND STRL 7.5 (GLOVE) ×1 IMPLANT
GLOVE BIOGEL PI INDICATOR 6.5 (GLOVE) ×12
GLOVE BIOGEL PI INDICATOR 7.5 (GLOVE) ×2
GLOVE ECLIPSE 6.5 STRL STRAW (GLOVE) ×3 IMPLANT
GLOVE SURG SS PI 7.5 STRL IVOR (GLOVE) ×3 IMPLANT
GOWN STRL REUS W/ TWL LRG LVL3 (GOWN DISPOSABLE) ×2 IMPLANT
GOWN STRL REUS W/ TWL XL LVL3 (GOWN DISPOSABLE) ×1 IMPLANT
GOWN STRL REUS W/TWL LRG LVL3 (GOWN DISPOSABLE) ×4
GOWN STRL REUS W/TWL XL LVL3 (GOWN DISPOSABLE) ×2
GRAFT HEMASHIELD 14X7 (Vascular Products) ×3 IMPLANT
GRAFT HEMASHIELD 18X30M (Vascular Products) ×3 IMPLANT
HEMOSTAT SNOW SURGICEL 2X4 (HEMOSTASIS) ×6 IMPLANT
INSERT FOGARTY 61MM (MISCELLANEOUS) ×6 IMPLANT
INSERT FOGARTY SM (MISCELLANEOUS) ×6 IMPLANT
INSERT FOGARTY XLG (MISCELLANEOUS) ×6 IMPLANT
KIT BASIN OR (CUSTOM PROCEDURE TRAY) ×3 IMPLANT
KIT ROOM TURNOVER OR (KITS) ×3 IMPLANT
LIQUID BAND (GAUZE/BANDAGES/DRESSINGS) ×6 IMPLANT
LOOP VESSEL MINI RED (MISCELLANEOUS) ×3 IMPLANT
NS IRRIG 1000ML POUR BTL (IV SOLUTION) ×6 IMPLANT
PACK AORTA (CUSTOM PROCEDURE TRAY) ×3 IMPLANT
PAD ARMBOARD 7.5X6 YLW CONV (MISCELLANEOUS) ×6 IMPLANT
PENCIL BUTTON BLDE SNGL 10FT (ELECTRODE) ×3 IMPLANT
SUT ETHIBOND 5 LR DA (SUTURE) IMPLANT
SUT PDS AB 1 TP1 54 (SUTURE) ×6 IMPLANT
SUT PROLENE 3 0 SH 48 (SUTURE) ×12 IMPLANT
SUT PROLENE 5 0 C 1 24 (SUTURE) ×12 IMPLANT
SUT PROLENE 5 0 C 1 36 (SUTURE) IMPLANT
SUT SILK 2 0 (SUTURE) ×2
SUT SILK 2 0 TIES 17X18 (SUTURE) ×2
SUT SILK 2 0SH CR/8 30 (SUTURE) ×3 IMPLANT
SUT SILK 2-0 18XBRD TIE 12 (SUTURE) ×1 IMPLANT
SUT SILK 2-0 18XBRD TIE BLK (SUTURE) ×1 IMPLANT
SUT SILK 3 0 (SUTURE) ×2
SUT SILK 3 0 TIES 17X18 (SUTURE) ×2
SUT SILK 3-0 18XBRD TIE 12 (SUTURE) ×1 IMPLANT
SUT SILK 3-0 18XBRD TIE BLK (SUTURE) ×1 IMPLANT
SUT VIC AB 2-0 CT1 27 (SUTURE) ×10
SUT VIC AB 2-0 CT1 TAPERPNT 27 (SUTURE) ×5 IMPLANT
SUT VIC AB 3-0 SH 27 (SUTURE) ×12
SUT VIC AB 3-0 SH 27X BRD (SUTURE) ×6 IMPLANT
SUT VICRYL 4-0 PS2 18IN ABS (SUTURE) ×12 IMPLANT
TOWEL BLUE STERILE X RAY DET (MISCELLANEOUS) ×6 IMPLANT
TRAY FOLEY W/METER SILVER 16FR (SET/KITS/TRAYS/PACK) ×3 IMPLANT
WATER STERILE IRR 1000ML POUR (IV SOLUTION) ×6 IMPLANT

## 2016-04-18 NOTE — Anesthesia Procedure Notes (Signed)
Procedure Name: Intubation Date/Time: 04/18/2016 7:44 AM Performed by: Dairl PonderJIANG, Miranda Frese Pre-anesthesia Checklist: Patient identified, Emergency Drugs available, Suction available, Patient being monitored and Timeout performed Patient Re-evaluated:Patient Re-evaluated prior to inductionOxygen Delivery Method: Circle system utilized Preoxygenation: Pre-oxygenation with 100% oxygen Intubation Type: IV induction Ventilation: Mask ventilation without difficulty and Oral airway inserted - appropriate to patient size Laryngoscope Size: Mac and 3 Grade View: Grade I Tube type: Oral Tube size: 7.0 mm Number of attempts: 1 Airway Equipment and Method: Stylet Placement Confirmation: ETT inserted through vocal cords under direct vision,  positive ETCO2 and breath sounds checked- equal and bilateral Secured at: 22 cm Tube secured with: Tape Dental Injury: Teeth and Oropharynx as per pre-operative assessment

## 2016-04-18 NOTE — Op Note (Signed)
Patient name: Debra Dennis MRN: [id number] DOB: 1956/04/08 Sex: female  04/18/2016 Pre-operative Diagnosis: Bilateral claudication Post-operative diagnosis:  Same Surgeon:  Durene CalBrabham, Wells Assistants:  Fabienne Brunsharles Fields, Karsten RoKim Trinh Procedure:   aorto-BiFemoral bypass (14x7) Anesthesia:  General Blood Loss:  See anesthesia record Specimens:  None   Findings:  proximal anastomosis was end to end.  Distal anastomosis was to the femoral bifurcation  Indications:  The patient suffers from bilateral claudication at approximately 100 feet.  Imaging studies revealed iliac occlusion.  She is here today for aortobifemoral bypass graft.  Procedure:  The patient was identified in the holding area and [facility]  The patient was then placed supine on the table. general anesthesia was administered.  The patient was prepped and draped in the usual sterile fashion.  A time out was called and antibiotics were administered.  Oblique incisions were made in both groins.  Cautery was used subcutaneous tissue down to the femoral sheath which was opened sharply.  The common femoral, superficial femoral, profundofemoral artery were individually isolated laterally.  Next, crossing circumflex iliac veins were ligated and the inguinal ligament.  A moist Ray-Tec's were placed in the groin.  Attention was turned towards the abdomen.  A midline incision was made from xiphoid to below the umbilicus.  The abdominal wall fascia was opened in the midline and extended throughout the length of the incision.  The abdominal cavity was inspected and was without gross pathology.  A Balfour retractor was placed.  The transverse colon was reflected superiorly.  The small bowel was mobilized to the patient's right.  A Omni tract retractor was used to aid with exposure.  The ligament of Treitz was taken down sharply.  The aorta was then exposed and circumferentially mobilized from the renal arteries distally.  The inferior  mesenteric artery was isolated as were bilateral common iliac arteries.  The aorta was heavily calcified.  I then created a tunnel into each groin staying directly anterior to the iliac artery, making sure I was posterior to the ureter.  At this point the patient was fully heparinized.  After the heparin circulated the infrarenal aorta was occluded with a aortic clamp.  A Harken clamp was used to occlude the aorta just below the renal arteries.  The aorta was then transected with a #11 blade.  I performed endarterectomy of the aorta down to just distal to the inferior mesenteric artery.  The aortic stump was then oversewn with 2 layers of 3-0 Prolene, just proximal to the inferior mesenteric artery.  Next, aortic endarterectomy was performed of the proximal portion of the aorta below the renal arteries.  The aortic wall was rather thin after the endarterectomy.  I then selected a 14 x 7 bifurcated dacryon graft.  A end to end anastomosis was performed between the graft and the aorta incorporating a felt strip with running 3-0 Prolene.  Once the anastomosis was completed, it was tested and found to be hemostatic.  Reoccluded the aorta and selected a 18 mm tube graft and advanced this over the anastomosis.  Next after removing the aortic clamp, the limbs of the graft were brought into the respective groin.  The femoral arteries were occluded bilaterally.  #11 blade was used to make an arteriotomy which was extended longitudinally with Potts scissors.  The arteriotomies began in the common femoral artery and went down to the origin of the profunda femoral artery bilaterally.  Each limb the graft was cut the  appropriate length and a running anastomosis was created with 5-0 Prolene.  Prior to completion the appropriate flushing maneuvers were performed.  Blood flow was reestablished to each lower extremity.  Hand-held Doppler was used to evaluate the signals in the common femoral superficial femoral and profunda femoral  artery.  All the signals were appropriate and graft dependent.  This point, the patient's heparin was reversed with protamine.  The bowel was inspected and felt to be well perfused.  I did not reimplant the inferior mesenteric artery.  The retroperitoneum was closed with running 2-0 Vicryl.  The small bowel was placed back into its anatomic position as was the transverse colon.  I inspected the bowel to make sure there was no injury.  The fascia was closed with 2 running #1 PDS suture.  Subcutaneous tissues closed with 20 and 3-0 Vicryl.  The skin was closed with running 4-0 Vicryl.  Dermabond was applied.  The oblique groin incisions were closed by reapproximating the femoral sheath with 2-0 Vicryl followed by 3-0 Vicryl in a simultaneous tissue and 4-0 Vicryl the skin.  The patient was successfully extubated and taken to recovery room in stable condition.  There is no immediate complications.   Disposition:  To PACU in stable condition.   Juleen China, M.D. [facility] Office: [phone] Pager:  [phone]

## 2016-04-18 NOTE — Anesthesia Preprocedure Evaluation (Signed)
Anesthesia Evaluation  Patient identified by MRN, date of birth, ID band Patient awake    Reviewed: Allergy & Precautions, NPO status , Patient's Chart, lab work & pertinent test results  Airway Mallampati: II  TM Distance: >3 FB Neck ROM: Full    Dental no notable dental hx.    Pulmonary Current Smoker,    Pulmonary exam normal breath sounds clear to auscultation       Cardiovascular hypertension, + CAD  Normal cardiovascular exam Rhythm:Regular Rate:Normal     Neuro/Psych TIAnegative psych ROS   GI/Hepatic negative GI ROS, Neg liver ROS,   Endo/Other  diabetes  Renal/GU negative Renal ROS  negative genitourinary   Musculoskeletal negative musculoskeletal ROS (+)   Abdominal   Peds negative pediatric ROS (+)  Hematology negative hematology ROS (+)   Anesthesia Other Findings   Reproductive/Obstetrics negative OB ROS                             Anesthesia Physical Anesthesia Plan  ASA: III  Anesthesia Plan: General   Post-op Pain Management:    Induction: Intravenous  Airway Management Planned: Oral ETT  Additional Equipment: Arterial line and CVP  Intra-op Plan:   Post-operative Plan: Possible Post-op intubation/ventilation  Informed Consent: I have reviewed the patients History and Physical, chart, labs and discussed the procedure including the risks, benefits and alternatives for the proposed anesthesia with the patient or authorized representative who has indicated his/her understanding and acceptance.   Dental advisory given  Plan Discussed with: CRNA and Surgeon  Anesthesia Plan Comments:         Anesthesia Quick Evaluation

## 2016-04-18 NOTE — H&P (View-Only) (Signed)
[facility] [place]  Patient name: Debra Dennis MRN: [id number] DOB: 11-24-1955 Sex: female  REASON FOR VISIT: follow up  HPI: Debra Dennis is a 60 y.o. female, who is referred today for evaluation of bilateral claudication.  The patient has recently undergone angiography by Dr. Allyson Sabal which revealed a 90% ostial calcified left common iliac lesion and an occluded right common and external iliac artery.  The patient had three-vessel runoff bilaterally.  She was felt to not be a good candidate for percutaneous revascularization and therefore was referred for surgical evaluation.  The patient reports that she gets symptoms after walking approximately 100 feet.  Her right leg bothers were the same as the left.  She states that her legs feel tight and feel like they're going to give out.  She denies ulcers or rest pain.  The patient has a significant coronary artery history.  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  She suffers from hypercholesterolemia which is managed with a statin.  She is on dual antiplatelet therapy.  She continues to be a half a pack a day smoker.  She has undergone ablation for trigeminal neuralgia.  The patient has an abdominal surgical history consisting of appendectomy and tubal ligation  She reports no changes since I last saw her.   No neurologic symptoms.  Claudication is stable.  No ulcers.  Past Medical History:  Diagnosis Date  . Bilateral carotid artery disease (HCC)   . CAD (coronary artery disease)   . Claudication (HCC)   . Diabetes mellitus (HCC)   . Hyperlipidemia   . Hypertension     Family History  Problem Relation Age of Onset  . CAD Son   . CAD Father     SOCIAL HISTORY: Social History  Substance Use Topics  . Smoking status: Current Some Day Smoker    Packs/day: 0.50    Types: Cigarettes  . Smokeless tobacco: Former Neurosurgeon  . Alcohol  use No    Allergies  Allergen Reactions  . Carbamazepine     Other reaction(s): Other Abnormal labs, liver enzymes were elevated  . Dilantin  [Phenytoin] Hives  . Other Other (See Comments)    Elevated liver function    Current Outpatient Prescriptions  Medication Sig Dispense Refill  . amLODipine (NORVASC) 5 MG tablet Take 5 mg by mouth daily.    Marland Kitchen aspirin EC 81 MG tablet Take 81 mg by mouth daily.    Marland Kitchen buPROPion (WELLBUTRIN SR) 150 MG 12 hr tablet Take 150 mg by mouth daily.    . Calcium Carbonate-Vitamin D (CALCIUM-VITAMIN D) 500-200 MG-UNIT tablet Take 1 tablet by mouth 2 (two) times daily.    . chlorthalidone (HYGROTON) 25 MG tablet Take 25 mg by mouth daily.    Tery Sanfilippo Sodium (COLACE PO) Take by mouth 2 (two) times daily.    . Loratadine (CLARITIN) 10 MG CAPS Take 10 mg by mouth daily.    . metFORMIN (GLUCOPHAGE) 500 MG tablet Take 500 mg by mouth 2 (two) times daily.    . nitroGLYCERIN (NITROSTAT) 0.4 MG SL tablet Place 1 tablet under the tongue every 5 (five) minutes as needed for chest pain.     . Omega-3 Fatty Acids (FISH OIL) 1000 MG CAPS Take 1 capsule by mouth 2 (two) times daily.    Marland Kitchen omeprazole (PRILOSEC) 20 MG capsule Take 20 mg by mouth 2 (two) times daily.    Marland Kitchen rOPINIRole (  REQUIP) 0.25 MG tablet Take 0.25 mg by mouth 3 (three) times daily as needed.     . rosuvastatin (CRESTOR) 40 MG tablet Take 1 tablet (40 mg total) by mouth daily. 90 tablet 3  . ticagrelor (BRILINTA) 90 MG TABS tablet Take 90 mg by mouth 2 (two) times daily.     No current facility-administered medications for this visit.     REVIEW OF SYSTEMS:  [X]  denotes positive finding, [ ]  denotes negative finding Cardiac  Comments:  Chest pain or chest pressure:    Shortness of breath upon exertion: x   Short of breath when lying flat:    Irregular heart rhythm: x       Vascular    Pain in calf, thigh, or hip brought on by ambulation: x   Pain in feet at night that wakes you up from your  sleep:     Blood clot in your veins:    Leg swelling:         Pulmonary    Oxygen at home:    Productive cough:     Wheezing:         Neurologic    Sudden weakness in arms or legs:     Sudden numbness in arms or legs:     Sudden onset of difficulty speaking or slurred speech:    Temporary loss of vision in one eye:     Problems with dizziness:         Gastrointestinal    Blood in stool:     Vomited blood:         Genitourinary    Burning when urinating:     Blood in urine:        Psychiatric    Major depression:         Hematologic    Bleeding problems:    Problems with blood clotting too easily:        Skin    Rashes or ulcers:        Constitutional    Fever or chills:      PHYSICAL EXAM: Vitals:   03/24/16 0846  BP: 129/77  Pulse: 88  Resp: 18  SpO2: 98%  Weight: 164 lb 3.2 oz (74.5 kg)  Height: 5\' 3"  (1.6 m)    GENERAL: The patient is a well-nourished female, in no acute distress. The vital signs are documented above. CARDIAC: There is a regular rate and rhythm.  VASCULAR: left carotid bruit.  Non-palpable pedal pulses PULMONARY: There is good air exchange bilaterally without wheezing or rales. ABDOMEN: Soft and non-tender with normal pitched bowel sounds.  MUSCULOSKELETAL: There are no major deformities or cyanosis. NEUROLOGIC: No focal weakness or paresthesias are detected. SKIN: There are no ulcers or rashes noted. PSYCHIATRIC: The patient has a normal affect.  DATA:  I have reviewed ehr CTA with the following findings 1. 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 2. Mild eccentric nonocclusive plaque in bilateral common femoral arteries. 3. Bilateral adrenal nodules, statistically most likely nonfunctional adrenal adenomas in the absence of a history of primary carcinoma.  MEDICAL ISSUES:  aortoiliac occlusive disease: I discussed with the  patient and her son proceeding with an aortobifemoral bypass graft. We described the details of the procedure as well as the expected hospital stay.  I discussed the potential complications including but not limited to cardiopulmonary complications, intestinal ischemia, incisional hernia, lower extremity  embolization, renal dysfunction.  She understands this and wishes to proceed. I will get formal clearance from Dr. Jens Som. She will need to be off of her Brilinta for 1 weeks.    Durene Cal, MD [facility] [phone] Pager [phone]

## 2016-04-18 NOTE — Interval H&P Note (Signed)
History and Physical Interval Note:  04/18/2016 7:25 AM  Debra HickBarbara Lanphear  has presented today for surgery, with the diagnosis of Peripheral vascular disease with bilateral lower extremity claudication I70.213  The various methods of treatment have been discussed with the patient and family. After consideration of risks, benefits and other options for treatment, the patient has consented to  Procedure(s): AORTOBIFEMORAL BYPASS GRAFT (Bilateral) as a surgical intervention .  The patient's history has been reviewed, patient examined, no change in status, stable for surgery.  I have reviewed the patient's chart and labs.  Questions were answered to the patient's satisfaction.     Durene CalBrabham, [place]

## 2016-04-18 NOTE — Transfer of Care (Signed)
Immediate Anesthesia Transfer of Care Note  Patient: Debra Dennis  Procedure(s) Performed: Procedure(s): AORTOBIFEMORAL BYPASS GRAFT (Bilateral)  Patient Location: PACU  Anesthesia Type:General  Level of Consciousness: awake, alert  and oriented  Airway & Oxygen Therapy: Patient Spontanous Breathing and Patient connected to face mask oxygen  Post-op Assessment: Report given to RN and Post -op Vital signs reviewed and stable  Post vital signs: Reviewed and stable  Last Vitals:  Vitals:   04/18/16 0615 04/18/16 0618  BP:  (!) 150/60  Pulse:  91  Resp: 18   Temp: 37.4 C     Last Pain:  Vitals:   04/18/16 0615  TempSrc: Oral      Patients Stated Pain Goal: 4 (04/18/16 0609)  Complications: No apparent anesthesia complications

## 2016-04-18 NOTE — Progress Notes (Signed)
Debra Dennis is positional depending on pt's wrist position/ When functioning properly correlates with cuff pressures.

## 2016-04-18 NOTE — Anesthesia Procedure Notes (Signed)
Central Venous Catheter Insertion Performed by: anesthesiologist Patient location: Pre-op. Preanesthetic checklist: patient identified, IV checked, site marked, risks and benefits discussed, surgical consent, monitors and equipment checked, pre-op evaluation, timeout performed and anesthesia consent Lidocaine 1% used for infiltration Landmarks identified Catheter size: 8.5 Fr Central line was placed.Sheath introducer Procedure performed using ultrasound guided technique. Attempts: 1 Following insertion, line sutured and dressing applied. Post procedure assessment: blood return through all ports, free fluid flow and no air. Patient tolerated the procedure well with no immediate complications.       

## 2016-04-18 NOTE — Anesthesia Postprocedure Evaluation (Signed)
Anesthesia Post Note  Patient: Debra HickBarbara Dennis  Procedure(s) Performed: Procedure(s) (LRB): AORTOBIFEMORAL BYPASS GRAFT (Bilateral)  Patient location during evaluation: PACU Anesthesia Type: General Level of consciousness: awake and alert Pain management: pain level controlled Vital Signs Assessment: post-procedure vital signs reviewed and stable Respiratory status: spontaneous breathing, nonlabored ventilation, respiratory function stable and patient connected to nasal cannula oxygen Cardiovascular status: blood pressure returned to baseline and stable Postop Assessment: no signs of nausea or vomiting Anesthetic complications: no    Last Vitals:  Vitals:   04/18/16 1250 04/18/16 1251  BP:  (!) 116/59  Pulse:  80  Resp:  19  Temp: 36.2 C     Last Pain:  Vitals:   04/18/16 0615  TempSrc: Oral                 [name] S

## 2016-04-18 NOTE — Progress Notes (Signed)
    S/p ABF for claudication  C/o dry mouth and abdominal pain  Palpable DP pulses Incisions clean and dry  Plan: ICU admission Toradol for pain if Creatinine ok tomorrow Mobilize ASAP Restart Brilinta over the weekend if no bleeding issues    V. Charlena CrossWells Bobbe Quilter IV, M.D. [facility] [place] Office: [phone] Pager:  [phone]

## 2016-04-19 ENCOUNTER — Inpatient Hospital Stay (HOSPITAL_COMMUNITY): Payer: 59

## 2016-04-19 LAB — COMPREHENSIVE METABOLIC PANEL
ALBUMIN: 2.9 g/dL — AB (ref 3.5–5.0)
ALT: 17 U/L (ref 14–54)
AST: 22 U/L (ref 15–41)
Alkaline Phosphatase: 51 U/L (ref 38–126)
Anion gap: 4 — ABNORMAL LOW (ref 5–15)
BILIRUBIN TOTAL: 0.6 mg/dL (ref 0.3–1.2)
CO2: 29 mmol/L (ref 22–32)
Calcium: 8.2 mg/dL — ABNORMAL LOW (ref 8.9–10.3)
Chloride: 104 mmol/L (ref 101–111)
Creatinine, Ser: 0.52 mg/dL (ref 0.44–1.00)
GFR calc Af Amer: >60 mL/min (ref >60)
GFR calc non Af Amer: >60 mL/min (ref >60)
GLUCOSE: 169 mg/dL — AB (ref 65–99)
POTASSIUM: 3.6 mmol/L (ref 3.5–5.1)
Sodium: 137 mmol/L (ref 135–145)
TOTAL PROTEIN: 5.2 g/dL — AB (ref 6.5–8.1)

## 2016-04-19 LAB — GLUCOSE, CAPILLARY
GLUCOSE-CAPILLARY: 134 mg/dL — AB (ref 65–99)
GLUCOSE-CAPILLARY: 169 mg/dL — AB (ref 65–99)
Glucose-Capillary: 142 mg/dL — ABNORMAL HIGH (ref 65–99)
Glucose-Capillary: 198 mg/dL — ABNORMAL HIGH (ref 65–99)
Glucose-Capillary: 192 mg/dL — ABNORMAL HIGH (ref 65–99)
Glucose-Capillary: 155 mg/dL — ABNORMAL HIGH (ref 65–99)

## 2016-04-19 LAB — CBC
HEMATOCRIT: 30.3 % — AB (ref 36.0–46.0)
HEMOGLOBIN: 9 g/dL — AB (ref 12.0–15.0)
MCH: 23.8 pg — ABNORMAL LOW (ref 26.0–34.0)
MCHC: 29.7 g/dL — ABNORMAL LOW (ref 30.0–36.0)
MCV: 80.2 fL (ref 78.0–100.0)
Platelets: 202 10*3/uL (ref 150–400)
RBC: 3.78 MIL/uL — AB (ref 3.87–5.11)
RDW: 16.9 % — AB (ref 11.5–15.5)
WBC: 13 10*3/uL — AB (ref 4.0–10.5)

## 2016-04-19 LAB — MAGNESIUM: Magnesium: 1.6 mg/dL — ABNORMAL LOW (ref 1.7–2.4)

## 2016-04-19 LAB — AMYLASE: Amylase: 40 U/L (ref 28–100)

## 2016-04-19 MED ORDER — POTASSIUM CHLORIDE 10 MEQ/50ML IV SOLN
INTRAVENOUS | Status: AC
  Administered 2016-04-19: 10 meq
  Filled 2016-04-19: qty 100

## 2016-04-19 MED ORDER — POTASSIUM CHLORIDE 10 MEQ/50ML IV SOLN
10.0000 meq | INTRAVENOUS | Status: AC
  Administered 2016-04-19: 10 meq via INTRAVENOUS

## 2016-04-19 MED ORDER — BUPROPION HCL ER (SR) 150 MG PO TB12
150.0000 mg | ORAL_TABLET | Freq: Every day | ORAL | Status: DC
  Filled 2016-04-19: qty 1

## 2016-04-19 MED ORDER — KETOROLAC TROMETHAMINE 30 MG/ML IJ SOLN
30.0000 mg | Freq: Four times a day (QID) | INTRAMUSCULAR | Status: AC
  Administered 2016-04-19 – 2016-04-22 (×12): 30 mg via INTRAVENOUS
  Filled 2016-04-19 (×13): qty 1

## 2016-04-19 MED ORDER — AMLODIPINE BESYLATE 5 MG PO TABS
5.0000 mg | ORAL_TABLET | Freq: Every day | ORAL | Status: DC
  Administered 2016-04-20 – 2016-04-23 (×4): 5 mg via ORAL
  Filled 2016-04-19 (×4): qty 1

## 2016-04-19 NOTE — Progress Notes (Signed)
  Progress Note    04/19/2016 10:53 AM 1 Day Post-Op  Subjective:  Feeling ok this a.m.  Vitals:   04/19/16 0900 04/19/16 1000  BP: (!) 106/51 (!) 91/54  Pulse: 99 (!) 102  Resp: 19 17  Temp: 97.7 F (36.5 C)     Physical Exam: Cardiac:  rrr Lungs:  Non labored Extremities:  Bilateral groins in tact, AT signals bilateral Abdomen:  Soft, appropriate ttp  CBC    Component Value Date/Time   WBC 13.0 (H) 04/19/2016 0413   RBC 3.78 (L) 04/19/2016 0413   HGB 9.0 (L) 04/19/2016 0413   HCT 30.3 (L) 04/19/2016 0413   PLT 202 04/19/2016 0413   MCV 80.2 04/19/2016 0413   MCH 23.8 (L) 04/19/2016 0413   MCHC 29.7 (L) 04/19/2016 0413   RDW 16.9 (H) 04/19/2016 0413   LYMPHSABS 2,225 10/30/2015 1049   MONOABS 801 10/30/2015 1049   EOSABS 178 10/30/2015 1049   BASOSABS 0 10/30/2015 1049    BMET    Component Value Date/Time   NA 137 04/19/2016 0413   K 3.6 04/19/2016 0413   CL 104 04/19/2016 0413   CO2 29 04/19/2016 0413   GLUCOSE 169 (H) 04/19/2016 0413   BUN <5 (L) 04/19/2016 0413   CREATININE 0.52 04/19/2016 0413   CREATININE 0.69 10/30/2015 1049   CALCIUM 8.2 (L) 04/19/2016 0413   GFRNONAA >60 04/19/2016 0413   GFRAA >60 04/19/2016 0413    INR    Component Value Date/Time   INR 0.93 04/11/2016 1319     Intake/Output Summary (Last 24 hours) at 04/19/16 1053 Last data filed at 04/19/16 1000  Gross per 24 hour  Intake          4488.33 ml  Output             1485 ml  Net          3003.33 ml     Assessment:  60 y.o. female is s/p AoBF  Plan: -continue icu care -ng tube to lws -will give toradol 30mg  q6 hour -restart home meds   Lashonda Sonneborn C. Randie Heinzain, MD [facility] [place] Office: [phone] Pager: [phone]  04/19/2016 10:53 AM

## 2016-04-19 NOTE — Evaluation (Signed)
Physical Therapy Evaluation Patient Details Name: Debra HickBarbara Dennis MRN: [id number] DOB: [birth year] Today's Date: 04/19/2016   History of Present Illness  Patient is a 60 yo female admitted 04/18/16 with BLE claudication.  Patient with aortoiliac occlusive disease.  Now s/p aortobifemoral bypass graft 04/18/16.  PMH:  PVD, BLE claudication, CAD, MI, HLD, Bil carotid artery disease, DM, HTN  Clinical Impression  Patient presents with problems listed below.  Will benefit from acute PT to maximize functional independence prior to d/c home.  Patient will need to reach supervision level to return home safely.  Recommend HHPT f/u at d/c.  If patient unable to reach supervision level, may need to consider inpatient option.    Follow Up Recommendations Home health PT;Supervision for mobility/OOB    Equipment Recommendations  Rolling walker with 5" wheels;3in1 (PT)    Recommendations for Other Services       Precautions / Restrictions Precautions Precautions: Fall Restrictions Weight Bearing Restrictions: No      Mobility  Bed Mobility Overal bed mobility: Needs Assistance;+2 for physical assistance Bed Mobility: Supine to Sit;Sit to Supine     Supine to sit: Max assist;+2 for physical assistance Sit to supine: Max assist;+2 for physical assistance   General bed mobility comments: Verbal cues for technique.  Patient able to bring LE's to EOB.  Assist to raise trunk to sitting position.  Once upright, patient able to maintain static sitting balance with min guard assist.   Patient required +2 max assist to control trunk and bring LE's onto bed to return to supine.    Transfers Overall transfer level: Needs assistance Equipment used: Rolling walker (2 wheeled) Transfers: Sit to/from Stand Sit to Stand: Mod assist;+2 physical assistance         General transfer comment: Verbal cues for hand placement.  Assist to rise to standing and for balance.  Once upright, patient required min  assist for static standing balance.  Patient c/o Rt thigh burning pain, and knee buckling.  Patient stood x 2 minutes, and then returned to sitting.  Pain decreased once sitting.  Ambulation/Gait             General Gait Details: NT  Stairs            Wheelchair Mobility    Modified Rankin (Stroke Patients Only)       Balance Overall balance assessment: Needs assistance Sitting-balance support: Bilateral upper extremity supported;Feet supported Sitting balance-Leahy Scale: Poor     Standing balance support: Bilateral upper extremity supported Standing balance-Leahy Scale: Poor                               Pertinent Vitals/Pain Pain Assessment: 0-10 Pain Score: 4  Pain Location: Abdomen; Rt thigh - with activity Pain Descriptors / Indicators: Burning;Sore Pain Intervention(s): Limited activity within patient's tolerance;Monitored during session;Repositioned    Home Living Family/patient expects to be discharged to:: Private residence Living Arrangements: Children (Son) Available Help at Discharge: Family;Friend(s);Available 24 hours/day (Son works; Sister coming into town) Type of Home: House Home Access: Stairs to enter Entrance Stairs-Rails: Right Entrance Stairs-Number of Steps: 2 Home Layout: One level Home Equipment: None      Prior Function Level of Independence: Independent               Hand Dominance        Extremity/Trunk Assessment   Upper Extremity Assessment: Generalized weakness  Lower Extremity Assessment: Generalized weakness;RLE deficits/detail RLE Deficits / Details: Strength grossly 4-/5.  Reports Rt thigh burning with weightbearing.       Communication   Communication: No difficulties  Cognition Arousal/Alertness: Awake/alert Behavior During Therapy: WFL for tasks assessed/performed Overall Cognitive Status: Within Functional Limits for tasks assessed                      General  Comments      Exercises     Assessment/Plan    PT Assessment Patient needs continued PT services  PT Problem List Decreased strength;Decreased activity tolerance;Decreased balance;Decreased mobility;Decreased knowledge of use of DME;Cardiopulmonary status limiting activity;Decreased knowledge of precautions;Pain          PT Treatment Interventions DME instruction;Gait training;Stair training;Functional mobility training;Therapeutic activities;Therapeutic exercise;Balance training;Patient/family education    PT Goals (Current goals can be found in the Care Plan section)  Acute Rehab PT Goals Patient Stated Goal: To go home PT Goal Formulation: With patient Time For Goal Achievement: 04/26/16 Potential to Achieve Goals: Good    Frequency Min 3X/week   Barriers to discharge        Co-evaluation               End of Session Equipment Utilized During Treatment: Oxygen Activity Tolerance: Patient limited by fatigue;Patient limited by pain Patient left: in bed;with call bell/phone within reach;with SCD's reapplied Nurse Communication: Mobility status         Time: 1610-9604 PT Time Calculation (min) (ACUTE ONLY): 18 min   Charges:   PT Evaluation $PT Eval High Complexity: 1 Procedure     PT G CodesVena Austria Apr 24, 2016, 4:38 PM Durenda Hurt. Renaldo Fiddler, [facility] Acute Rehab Services Pager [phone]

## 2016-04-20 ENCOUNTER — Inpatient Hospital Stay (HOSPITAL_COMMUNITY): Payer: 59

## 2016-04-20 LAB — CBC
HCT: 26.2 % — ABNORMAL LOW (ref 36.0–46.0)
HEMOGLOBIN: 7.7 g/dL — AB (ref 12.0–15.0)
MCH: 23.8 pg — ABNORMAL LOW (ref 26.0–34.0)
MCHC: 29.4 g/dL — AB (ref 30.0–36.0)
MCV: 81.1 fL (ref 78.0–100.0)
PLATELETS: 190 10*3/uL (ref 150–400)
RBC: 3.23 MIL/uL — AB (ref 3.87–5.11)
RDW: 17.1 % — ABNORMAL HIGH (ref 11.5–15.5)
WBC: 11.7 10*3/uL — ABNORMAL HIGH (ref 4.0–10.5)

## 2016-04-20 LAB — BASIC METABOLIC PANEL
ANION GAP: 4 — AB (ref 5–15)
BUN: 6 mg/dL (ref 6–20)
CALCIUM: 8.3 mg/dL — AB (ref 8.9–10.3)
CO2: 29 mmol/L (ref 22–32)
Chloride: 103 mmol/L (ref 101–111)
Creatinine, Ser: 0.55 mg/dL (ref 0.44–1.00)
GLUCOSE: 171 mg/dL — AB (ref 65–99)
Potassium: 4 mmol/L (ref 3.5–5.1)
Sodium: 136 mmol/L (ref 135–145)

## 2016-04-20 LAB — GLUCOSE, CAPILLARY
GLUCOSE-CAPILLARY: 128 mg/dL — AB (ref 65–99)
GLUCOSE-CAPILLARY: 126 mg/dL — AB (ref 65–99)
GLUCOSE-CAPILLARY: 171 mg/dL — AB (ref 65–99)
Glucose-Capillary: 151 mg/dL — ABNORMAL HIGH (ref 65–99)

## 2016-04-20 NOTE — Progress Notes (Signed)
Patient transferred to 2W28 via wheelchair. Receiving RN at bedside. All belongings including cell phone, personal belonging bags, flowers, and SCDs transferred with patient.

## 2016-04-20 NOTE — Progress Notes (Signed)
Patient ambulated in hall with walker, gait steady. Patient ambulated to bathroom, voided, passed gas.

## 2016-04-20 NOTE — Progress Notes (Signed)
Patient has arrived on unit as a TX from 2S. Patient oriented to unit, assessed, placed on tele, VS were stable.  Pt. Has no pain and accompanied by family

## 2016-04-20 NOTE — Evaluation (Signed)
Occupational Therapy Evaluation and Discharge Patient Details Name: Debra Dennis MRN: [id number] DOB: 10-Apr-1956 Today's Date: 04/20/2016    History of Present Illness Patient is a 60 yo female admitted 04/18/16 with BLE claudication.  Patient with aortoiliac occlusive disease.  Now s/p aortobifemoral bypass graft 04/18/16.  PMH:  PVD, BLE claudication, CAD, MI, HLD, Bil carotid artery disease, DM, HTN   Clinical Impression   This 60 yo female admitted and underwent above presents to acute OT with all education completed, we will D/C from acute OT.    Follow Up Recommendations  No OT follow up;Supervision/Assistance - 24 hour    Equipment Recommendations  None recommended by OT       Precautions / Restrictions Precautions Precautions: Fall Precaution Comments: Abdominal and Bil groin wounds Restrictions Weight Bearing Restrictions: No      Mobility Bed Mobility Overal bed mobility: Modified Independent Bed Mobility: Supine to Sit     Supine to sit: Supervision;HOB elevated (and use of rail)        Transfers Overall transfer level: Needs assistance Equipment used: Rolling walker (2 wheeled) Transfers: Sit to/from Stand Sit to Stand: Min guard         General transfer comment: Verbal cues for hand placement.  Encouraged her to stand up straight. Minguard A ambulating 100 feet in hallway with RW    Balance Overall balance assessment: Needs assistance Sitting-balance support: Bilateral upper extremity supported;Feet supported Sitting balance-Leahy Scale: Fair     Standing balance support: Bilateral upper extremity supported Standing balance-Leahy Scale: Poor Standing balance comment: reliant on RW                            ADL Overall ADL's : Needs assistance/impaired Eating/Feeding: Independent;Sitting   Grooming: Supervision/safety;Standing;Set up   Upper Body Bathing: Sitting;Set up   Lower Body Bathing: Minimal assistance (min guard  A sit<>stand)   Upper Body Dressing : Set up;Sitting   Lower Body Dressing: Minimal assistance (min guard A sit<>stand)   Toilet Transfer: Min guard;Ambulation;RW Toilet Transfer Details (indicate cue type and reason): bed>out and down hallway>bed Toileting- Clothing Manipulation and Hygiene: Sit to/from stand;Min guard         General ADL Comments: Pt reports she will sponge bath until she feels she could safely stand to take a shower. She does not feel getting up from commode will be an issue (she has a sink beside to hold onto). She reports she will wear slippers not socks and maybe not underwear if that is an issue or have someone A her. educated when donning underwear and pants to put RLE in them first.               Pertinent Vitals/Pain Pain Assessment: 0-10 Pain Score: 5  Pain Location: right thigh Pain Descriptors / Indicators: Aching;Tightness;Sore Pain Intervention(s): Monitored during session;Repositioned     Hand Dominance Right   Extremity/Trunk Assessment Upper Extremity Assessment Upper Extremity Assessment: Overall WFL for tasks assessed           Communication Communication Communication: No difficulties   Cognition Arousal/Alertness: Awake/alert Behavior During Therapy: WFL for tasks assessed/performed Overall Cognitive Status: Within Functional Limits for tasks assessed                                Home Living Family/patient expects to be discharged to:: Private residence Living Arrangements: Children (son) Available  Help at Discharge: Family;Available PRN/intermittently (have recommended that she have 24/7 initially (1st week or more if  needed)) Type of Home: House Home Access: Stairs to enter Entergy CorporationEntrance Stairs-Number of Steps: 2 Entrance Stairs-Rails: Right Home Layout: One level     Bathroom Shower/Tub: Tub/shower unit;Curtain Shower/tub characteristics: Engineer, building servicesCurtain Bathroom Toilet: Standard     Home Equipment: None    Additional Comments: Pt reports she may sleep in recliner initially      Prior Functioning/Environment Level of Independence: Independent        Comments: Charity [facility] trade [facility] [employer], was working Monday prior to coming into hospital             OT Goals(Current goals can be found in the care plan section) Acute Rehab OT Goals Patient Stated Goal: To go home  OT Frequency:                End of Session Equipment Utilized During Treatment: Gait belt;Rolling walker Nurse Communication:  (pt needs to walk one more time in hallway before bedtime. Pt asking about eating)  Activity Tolerance: Patient tolerated treatment well Patient left: with call bell/phone within reach;in bed;with family/visitor present   Time: 1610-96041400-1432 OT Time Calculation (min): 32 min Charges:  OT General Charges $OT Visit: 1 Procedure OT Evaluation $OT Eval Moderate Complexity: 1 Procedure OT Treatments $Self Care/Home Management : 8-22 mins  Evette GeorgesLeonard, Debra Dennis [phone] 04/20/2016, 2:43 PM

## 2016-04-20 NOTE — Progress Notes (Signed)
Central line removed. Patient tolerated procedure well. No adverse events noted. Will cont to monitor patient.

## 2016-04-20 NOTE — Progress Notes (Addendum)
  [facility] Progress Note  Subjective  - POD #2  Complaining of right anterior thigh burning. No nausea. No flatus.   Objective Vitals:   04/20/16 0700 04/20/16 0745  BP: (!) 96/51 (!) 119/56  Pulse: 87 99  Resp: 15 (!) 22  Temp:      Intake/Output Summary (Last 24 hours) at 04/20/16 0813 Last data filed at 04/20/16 0700  Gross per 24 hour  Intake             2480 ml  Output             1155 ml  Net             1325 ml   Lungs with fine rales.  Abdomen soft and non tender. Incision clean and intact.  Groin incisions with minimal ecchymosis. No hematoma. Incisions clean.  Palpable DP pulses bilaterally.    Assessment/Planning: 60 y.o. female is s/p: aortobifemoral bypass 2 Days Post-Op   No nausea but not passing flatus yet. Continue NPO except for ice chips.  Hgb down to 7.7. No signs of bleeding. Asymptomatic. Will monitor. Check hgb tomorrow. Possibly restart Brilinta tomorrow if Hgb stable.  Creatinine ok on toradol. Ambulate. Incentive spirometry.  Transfer to 2W.   Cala BradfordKimberly A Trinh 04/20/2016 8:13 AM --  Laboratory CBC    Component Value Date/Time   WBC 11.7 (H) 04/20/2016 0400   HGB 7.7 (L) 04/20/2016 0400   HCT 26.2 (L) 04/20/2016 0400   PLT 190 04/20/2016 0400    BMET    Component Value Date/Time   NA 136 04/20/2016 0400   K 4.0 04/20/2016 0400   CL 103 04/20/2016 0400   CO2 29 04/20/2016 0400   GLUCOSE 171 (H) 04/20/2016 0400   BUN 6 04/20/2016 0400   CREATININE 0.55 04/20/2016 0400   CREATININE 0.69 10/30/2015 1049   CALCIUM 8.3 (L) 04/20/2016 0400   GFRNONAA >60 04/20/2016 0400   GFRAA >60 04/20/2016 0400    COAG Lab Results  Component Value Date   INR 0.93 04/11/2016   INR 1.00 10/30/2015   No results found for: PTT  Antibiotics Anti-infectives    Start     Dose/Rate Route Frequency Ordered Stop   04/18/16 2000  cefUROXime (ZINACEF) 1.5 g in dextrose 5 % 50 mL IVPB     1.5 g 100 mL/hr over 30 Minutes  Intravenous Every 12 hours 04/18/16 1542 04/19/16 0954   04/18/16 1215  cefUROXime (ZINACEF) 1.5 g in dextrose 5 % 50 mL IVPB     1.5 g 100 mL/hr over 30 Minutes Intravenous To Surgery 04/18/16 1215 04/19/16 1230   04/18/16 0549  cefUROXime (ZINACEF) 1.5 g in dextrose 5 % 50 mL IVPB     1.5 g 100 mL/hr over 30 Minutes Intravenous 30 min pre-op 04/18/16 0549 04/18/16 1216       Maris BergerKimberly Trinh, PA-C [facility] Office: [phone] Pager: [phone] 04/20/2016 8:13 AM   I have independently interviewed patient and agree with PA assessment and plan above. Transfer to floor today. Recheck cbc tomorrow.   Tommie Dejoseph C. Randie Heinzain, MD [facility] [place] Office: [phone] Pager: [phone]

## 2016-04-21 ENCOUNTER — Encounter (HOSPITAL_COMMUNITY): Payer: Self-pay | Admitting: Surgery

## 2016-04-21 LAB — GLUCOSE, CAPILLARY
GLUCOSE-CAPILLARY: 144 mg/dL — AB (ref 65–99)
GLUCOSE-CAPILLARY: 156 mg/dL — AB (ref 65–99)
GLUCOSE-CAPILLARY: 93 mg/dL (ref 65–99)
Glucose-Capillary: 131 mg/dL — ABNORMAL HIGH (ref 65–99)
Glucose-Capillary: 142 mg/dL — ABNORMAL HIGH (ref 65–99)
Glucose-Capillary: 152 mg/dL — ABNORMAL HIGH (ref 65–99)
Glucose-Capillary: 155 mg/dL — ABNORMAL HIGH (ref 65–99)

## 2016-04-21 LAB — CBC
HEMATOCRIT: 25.6 % — AB (ref 36.0–46.0)
Hemoglobin: 7.6 g/dL — ABNORMAL LOW (ref 12.0–15.0)
MCH: 24.1 pg — AB (ref 26.0–34.0)
MCHC: 29.7 g/dL — ABNORMAL LOW (ref 30.0–36.0)
MCV: 81 fL (ref 78.0–100.0)
PLATELETS: 197 10*3/uL (ref 150–400)
RBC: 3.16 MIL/uL — ABNORMAL LOW (ref 3.87–5.11)
RDW: 16.7 % — AB (ref 11.5–15.5)
WBC: 9.3 10*3/uL (ref 4.0–10.5)

## 2016-04-21 LAB — BASIC METABOLIC PANEL
Anion gap: 5 (ref 5–15)
BUN: 5 mg/dL — AB (ref 6–20)
CHLORIDE: 105 mmol/L (ref 101–111)
CO2: 28 mmol/L (ref 22–32)
CREATININE: 0.59 mg/dL (ref 0.44–1.00)
Calcium: 8.3 mg/dL — ABNORMAL LOW (ref 8.9–10.3)
GFR calc Af Amer: >60 mL/min (ref >60)
GFR calc non Af Amer: >60 mL/min (ref >60)
GLUCOSE: 159 mg/dL — AB (ref 65–99)
POTASSIUM: 4 mmol/L (ref 3.5–5.1)
Sodium: 138 mmol/L (ref 135–145)

## 2016-04-21 MED FILL — Sodium Chloride IV Soln 0.9%: INTRAVENOUS | Qty: 1000 | Status: AC

## 2016-04-21 MED FILL — Heparin Sodium (Porcine) Inj 1000 Unit/ML: INTRAMUSCULAR | Qty: 30 | Status: AC

## 2016-04-21 NOTE — Progress Notes (Signed)
Physical Therapy Treatment Patient Details Name: Debra Dennis MRN: [id number] DOB: 03-Mar-1956 Today's Date: 04/21/2016    History of Present Illness Patient is a 60 yo female admitted 04/18/16 with BLE claudication.  Patient with aortoiliac occlusive disease.  Now s/p aortobifemoral bypass graft 04/18/16.  PMH:  PVD, BLE claudication, CAD, MI, HLD, Bil carotid artery disease, DM, HTN    PT Comments    Pt making excellent progress and feel she will be able to return home mobilizing adequately. Will try amb without assistive device during next session.  Follow Up Recommendations  Supervision - Intermittent;No PT follow up     Equipment Recommendations  None recommended by PT    Recommendations for Other Services       Precautions / Restrictions Precautions Precautions: Fall Restrictions Weight Bearing Restrictions: No    Mobility  Bed Mobility Overal bed mobility: Modified Independent Bed Mobility: Supine to Sit;Sit to Supine     Supine to sit: Modified independent (Device/Increase time);HOB elevated Sit to supine: Modified independent (Device/Increase time);HOB elevated   General bed mobility comments: Incr time  Transfers Overall transfer level: Modified independent Equipment used: Rolling walker (2 wheeled) Transfers: Sit to/from Stand Sit to Stand: Modified independent (Device/Increase time)         General transfer comment: Good hand placement with walker  Ambulation/Gait Ambulation/Gait assistance: Supervision Ambulation Distance (Feet): 350 Feet Assistive device: Rolling walker (2 wheeled) Gait Pattern/deviations: Step-through pattern;Decreased stride length Gait velocity: decr Gait velocity interpretation: Below normal speed for age/gender General Gait Details: Guarded but steady gait with walker. Amb on RA with SpO2 91% after amb   Stairs            Wheelchair Mobility    Modified Rankin (Stroke Patients Only)       Balance Overall  balance assessment: Needs assistance Sitting-balance support: No upper extremity supported;Feet supported Sitting balance-Leahy Scale: Normal     Standing balance support: No upper extremity supported Standing balance-Leahy Scale: Fair                      Cognition Arousal/Alertness: Awake/alert Behavior During Therapy: WFL for tasks assessed/performed Overall Cognitive Status: Within Functional Limits for tasks assessed                      Exercises      General Comments        Pertinent Vitals/Pain Pain Assessment: Faces Faces Pain Scale: Hurts little more Pain Location: rt thigh with amb Pain Descriptors / Indicators: Aching Pain Intervention(s): Limited activity within patient's tolerance;Monitored during session    Home Living                      Prior Function            PT Goals (current goals can now be found in the care plan section) Acute Rehab PT Goals PT Goal Formulation: With patient Time For Goal Achievement: 04/26/16 Potential to Achieve Goals: Good Progress towards PT goals: Goals met and updated - see care plan    Frequency    Min 3X/week      PT Plan Discharge plan needs to be updated    Co-evaluation             End of Session   Activity Tolerance: Patient tolerated treatment well Patient left: in bed;with call bell/phone within reach     Time: 0630-1601 PT Time Calculation (min) (ACUTE ONLY): 20 min  Charges:  $Gait Training: 8-22 mins                    G Codes:      Landry Lookingbill 04-30-2016, 10:49 AM Suanne Marker PT [phone]

## 2016-04-21 NOTE — Progress Notes (Addendum)
  [facility] Progress Note  Subjective  - POD #3  Doing well. Abdomen hurts when moving. No nausea. Passing flatus.   Objective Vitals:   04/20/16 2026 04/21/16 0456  BP: (!) 108/55 (!) 116/50  Pulse: (!) 102 87  Resp: 20 18  Temp: 98.7 F (37.1 C) 98.8 F (37.1 C)    Intake/Output Summary (Last 24 hours) at 04/21/16 0741 Last data filed at 04/21/16 0400  Gross per 24 hour  Intake             2230 ml  Output              400 ml  Net             1830 ml   Abdomen with active bowel sounds. Incision intact. Soft and non tender.  Bilateral groin incisions clean and intact.  Palpable DP and PT pulses bilaterally.   Assessment/Planning: 60 y.o. female is s/p: aortobifemoral bypass 3 Days Post-Op   Passing flatus today. Will advance to clears.  Hgb 7.6 today. 7.7 yesterday, 9.0 on 10/14. No signs of bleeding. Asymptomatic. Will ask Dr. Myra GianottiBrabham about when to start Brilinta.  Ambulate. Encourage incentive spirometry. Pain well tolerated on toradol. Creatinine ok. Home in next few days.   Raymond GurneyKimberly A Trinh 04/21/2016 7:41 AM --  Laboratory CBC    Component Value Date/Time   WBC 9.3 04/21/2016 0309   HGB 7.6 (L) 04/21/2016 0309   HCT 25.6 (L) 04/21/2016 0309   PLT 197 04/21/2016 0309    BMET    Component Value Date/Time   NA 138 04/21/2016 0309   K 4.0 04/21/2016 0309   CL 105 04/21/2016 0309   CO2 28 04/21/2016 0309   GLUCOSE 159 (H) 04/21/2016 0309   BUN 5 (L) 04/21/2016 0309   CREATININE 0.59 04/21/2016 0309   CREATININE 0.69 10/30/2015 1049   CALCIUM 8.3 (L) 04/21/2016 0309   GFRNONAA >60 04/21/2016 0309   GFRAA >60 04/21/2016 0309    COAG Lab Results  Component Value Date   INR 0.93 04/11/2016   INR 1.00 10/30/2015   No results found for: PTT  Antibiotics Anti-infectives    Start     Dose/Rate Route Frequency Ordered Stop   04/18/16 2000  cefUROXime (ZINACEF) 1.5 g in dextrose 5 % 50 mL IVPB     1.5 g 100 mL/hr over 30  Minutes Intravenous Every 12 hours 04/18/16 1542 04/19/16 0954   04/18/16 1215  cefUROXime (ZINACEF) 1.5 g in dextrose 5 % 50 mL IVPB     1.5 g 100 mL/hr over 30 Minutes Intravenous To Surgery 04/18/16 1215 04/19/16 1230   04/18/16 0549  cefUROXime (ZINACEF) 1.5 g in dextrose 5 % 50 mL IVPB     1.5 g 100 mL/hr over 30 Minutes Intravenous 30 min pre-op 04/18/16 0549 04/18/16 1216       Maris BergerKimberly Trinh, PA-C [facility] Office: [phone] Pager: [phone] 04/21/2016 7:41 AM  I agree with the above.  The patient is passing flatus.  Her incisions are intact.  She has palpable pedal pulses. Okay to start Brilinta tomorrow. We'll stop IV fluids and start clear liquid diet and advance as tolerated Continue with mobilization Keep groin areas dry Anticipate discharge in 1-2 days  Wells Hagar Sadiq

## 2016-04-22 LAB — CBC
HEMATOCRIT: 25.1 % — AB (ref 36.0–46.0)
Hemoglobin: 7.6 g/dL — ABNORMAL LOW (ref 12.0–15.0)
MCH: 24.4 pg — AB (ref 26.0–34.0)
MCHC: 30.3 g/dL (ref 30.0–36.0)
MCV: 80.7 fL (ref 78.0–100.0)
PLATELETS: 233 10*3/uL (ref 150–400)
RBC: 3.11 MIL/uL — ABNORMAL LOW (ref 3.87–5.11)
RDW: 16.7 % — AB (ref 11.5–15.5)
WBC: 7.7 10*3/uL (ref 4.0–10.5)

## 2016-04-22 LAB — BASIC METABOLIC PANEL
Anion gap: 8 (ref 5–15)
BUN: 5 mg/dL — AB (ref 6–20)
CHLORIDE: 104 mmol/L (ref 101–111)
CO2: 25 mmol/L (ref 22–32)
CREATININE: 0.59 mg/dL (ref 0.44–1.00)
Calcium: 8.5 mg/dL — ABNORMAL LOW (ref 8.9–10.3)
GFR calc Af Amer: >60 mL/min (ref >60)
GFR calc non Af Amer: >60 mL/min (ref >60)
Glucose, Bld: 133 mg/dL — ABNORMAL HIGH (ref 65–99)
POTASSIUM: 3.6 mmol/L (ref 3.5–5.1)
Sodium: 137 mmol/L (ref 135–145)

## 2016-04-22 LAB — GLUCOSE, CAPILLARY
GLUCOSE-CAPILLARY: 106 mg/dL — AB (ref 65–99)
GLUCOSE-CAPILLARY: 111 mg/dL — AB (ref 65–99)
Glucose-Capillary: 131 mg/dL — ABNORMAL HIGH (ref 65–99)
Glucose-Capillary: 139 mg/dL — ABNORMAL HIGH (ref 65–99)
Glucose-Capillary: 127 mg/dL — ABNORMAL HIGH (ref 65–99)
Glucose-Capillary: 166 mg/dL — ABNORMAL HIGH (ref 65–99)

## 2016-04-22 MED ORDER — FAMOTIDINE 20 MG PO TABS
20.0000 mg | ORAL_TABLET | Freq: Two times a day (BID) | ORAL | Status: DC
  Administered 2016-04-22 – 2016-04-23 (×3): 20 mg via ORAL
  Filled 2016-04-22 (×3): qty 1

## 2016-04-22 MED ORDER — METFORMIN HCL 500 MG PO TABS
500.0000 mg | ORAL_TABLET | Freq: Two times a day (BID) | ORAL | Status: DC
  Administered 2016-04-22 – 2016-04-23 (×3): 500 mg via ORAL
  Filled 2016-04-22 (×3): qty 1

## 2016-04-22 MED ORDER — OXYCODONE-ACETAMINOPHEN 5-325 MG PO TABS
1.0000 | ORAL_TABLET | ORAL | Status: DC | PRN
  Administered 2016-04-22 – 2016-04-23 (×2): 1 via ORAL
  Filled 2016-04-22 (×2): qty 1

## 2016-04-22 MED ORDER — TICAGRELOR 90 MG PO TABS
90.0000 mg | ORAL_TABLET | Freq: Two times a day (BID) | ORAL | Status: DC
  Administered 2016-04-22: 90 mg via ORAL
  Filled 2016-04-22: qty 1

## 2016-04-22 MED ORDER — TICAGRELOR 90 MG PO TABS: 90.0000 mg | ORAL_TABLET | Freq: Two times a day (BID) | ORAL | 1 refills | Status: DC

## 2016-04-22 MED ORDER — FUROSEMIDE 10 MG/ML IJ SOLN
20.0000 mg | Freq: Once | INTRAMUSCULAR | Status: AC
  Administered 2016-04-22: 20 mg via INTRAVENOUS
  Filled 2016-04-22: qty 2

## 2016-04-22 MED ORDER — OXYCODONE-ACETAMINOPHEN 5-325 MG PO TABS: 1.0000 | ORAL_TABLET | Freq: Four times a day (QID) | ORAL | 0 refills | Status: DC | PRN

## 2016-04-22 NOTE — Progress Notes (Signed)
Physical Therapy Treatment Patient Details Name: Debra Dennis MRN: [id number] DOB: 1956/06/28 Today's Date: 04/22/2016    History of Present Illness Patient is a 60 yo female admitted 04/18/16 with BLE claudication.  Patient with aortoiliac occlusive disease.  Now s/p aortobifemoral bypass graft 04/18/16.  PMH:  PVD, BLE claudication, CAD, MI, HLD, Bil carotid artery disease, DM, HTN    PT Comments    Pt moving well and able to increase ambulation distance without AD as well as perform stairs. Pt remains limited by claudication pain with increased distance. Educated for HEP and encouraged ambulation 3x/day. Will continue to follow to maximize strength and gait.   Follow Up Recommendations  Supervision - Intermittent;No PT follow up     Equipment Recommendations       Recommendations for Other Services       Precautions / Restrictions Precautions Precautions: Fall Restrictions Weight Bearing Restrictions: No    Mobility  Bed Mobility Overal bed mobility: Modified Independent                Transfers Overall transfer level: Modified independent                  Ambulation/Gait Ambulation/Gait assistance: Supervision Ambulation Distance (Feet): 450 Feet Assistive device: None Gait Pattern/deviations: Step-through pattern;Decreased stride length   Gait velocity interpretation: Below normal speed for age/gender General Gait Details: slow steady gait throughout with decreased stance on RLE last 40' due to pain   Stairs Stairs: Yes Stairs assistance: Modified independent (Device/Increase time) Stair Management: One rail Left;Alternating pattern;Forwards Number of Stairs: 4    Wheelchair Mobility    Modified Rankin (Stroke Patients Only)       Balance Overall balance assessment: No apparent balance deficits (not formally assessed)                                  Cognition Arousal/Alertness: Awake/alert Behavior During Therapy:  WFL for tasks assessed/performed Overall Cognitive Status: Within Functional Limits for tasks assessed                      Exercises General Exercises - Lower Extremity Long Arc Quad: AROM;Both;15 reps;Seated Hip ABduction/ADduction: AROM;15 reps;Both;Seated Hip Flexion/Marching: AROM;15 reps;Both;Seated    General Comments        Pertinent Vitals/Pain Pain Score: 6  Pain Location: right leg with weight bearing only Pain Descriptors / Indicators: Burning Pain Intervention(s): Limited activity within patient's tolerance;Monitored during session;Repositioned    Home Living                      Prior Function            PT Goals (current goals can now be found in the care plan section) Progress towards PT goals: Goals met and updated - see care plan    Frequency           PT Plan Current plan remains appropriate    Co-evaluation             End of Session   Activity Tolerance: Patient tolerated treatment well Patient left: in chair;with call bell/phone within reach     Time: 0812-0828 PT Time Calculation (min) (ACUTE ONLY): 16 min  Charges:  $Gait Training: 8-22 mins                    G Codes:  Lanetta Inch Beth 04/22/2016, 9:31 AM Elwyn Reach, [place]

## 2016-04-22 NOTE — Progress Notes (Addendum)
Called into room because pt complaining SOB. Pt O2 100% on 2L [place]. Educated on deep breathing. Educated on IS - pt made it to 1000. Pt made comfortable. Call light within reach, will continue to monitor.   Storm Friskaitlin S [place]

## 2016-04-22 NOTE — Progress Notes (Signed)
  [facility] Progress Note  Subjective  - POD #4  Doing well today. Denies nausea. Had BM. Still with some burning sensation right anterior thigh.   Objective Vitals:   04/21/16 2001 04/22/16 0359  BP: (!) 129/46 (!) 134/50  Pulse:  92  Resp: 18 18  Temp: 99.2 F (37.3 C) 98.3 F (36.8 C)    Intake/Output Summary (Last 24 hours) at 04/22/16 0908 Last data filed at 04/21/16 2030  Gross per 24 hour  Intake              700 ml  Output                0 ml  Net              700 ml   Abdomen soft and nontender. Incisions clean and intact.  Palpable DP pulses bilaterally.   Assessment/Planning: 60 y.o. female is s/p: aortobifemoral bypass 4 Days Post-Op   Tolerated clears well. Will advance to regular diet today. Had BM.  Hgb stable. Restart Brilinta today.  Had fever yesterday 100.9. Afebrile this am. No leukocytosis. Will follow.  Anterior thigh burning likely nerve irritation from surgery. Discussed that this will improve with time.  Encourage IS.  Continue to mobilize. Plan d/c home tomorrow if tolerating regular diet.   Raymond GurneyKimberly A Trinh 04/22/2016 9:08 AM --  Laboratory CBC    Component Value Date/Time   WBC 7.7 04/22/2016 0218   HGB 7.6 (L) 04/22/2016 0218   HCT 25.1 (L) 04/22/2016 0218   PLT 233 04/22/2016 0218    BMET    Component Value Date/Time   NA 137 04/22/2016 0218   K 3.6 04/22/2016 0218   CL 104 04/22/2016 0218   CO2 25 04/22/2016 0218   GLUCOSE 133 (H) 04/22/2016 0218   BUN 5 (L) 04/22/2016 0218   CREATININE 0.59 04/22/2016 0218   CREATININE 0.69 10/30/2015 1049   CALCIUM 8.5 (L) 04/22/2016 0218   GFRNONAA >60 04/22/2016 0218   GFRAA >60 04/22/2016 0218    COAG Lab Results  Component Value Date   INR 0.93 04/11/2016   INR 1.00 10/30/2015   No results found for: PTT  Antibiotics Anti-infectives    Start     Dose/Rate Route Frequency Ordered Stop   04/18/16 2000  cefUROXime (ZINACEF) 1.5 g in dextrose 5 % 50  mL IVPB     1.5 g 100 mL/hr over 30 Minutes Intravenous Every 12 hours 04/18/16 1542 04/19/16 0954   04/18/16 1215  cefUROXime (ZINACEF) 1.5 g in dextrose 5 % 50 mL IVPB     1.5 g 100 mL/hr over 30 Minutes Intravenous To Surgery 04/18/16 1215 04/19/16 1230   04/18/16 0549  cefUROXime (ZINACEF) 1.5 g in dextrose 5 % 50 mL IVPB     1.5 g 100 mL/hr over 30 Minutes Intravenous 30 min pre-op 04/18/16 0549 04/18/16 1216       Maris BergerKimberly Trinh, PA-C [facility] Office: [phone] Pager: [phone] 04/22/2016 9:08 AM  Doesn't feel as good this afternoon.  Has had SOB after walking and is now back on O2.  I will give her 20mg  of IV lasix to help with fluid removal and hopefully improve her SOB  She is going to discuss with her cardiologist if she can stop her Brilinta.  Will continue ASA  Check labs in AM  D/C possible tomorrow   WElls Devone Bonilla

## 2016-04-22 NOTE — Progress Notes (Signed)
Insurance check completed for Brilinta Co-pay for Brinlinta 90mg  BID $75.00 for 30 day supply.  [facility] [place] [facility]  No PA required.

## 2016-04-23 LAB — BASIC METABOLIC PANEL
ANION GAP: 9 (ref 5–15)
BUN: 6 mg/dL (ref 6–20)
CALCIUM: 8.9 mg/dL (ref 8.9–10.3)
CO2: 28 mmol/L (ref 22–32)
Chloride: 102 mmol/L (ref 101–111)
Creatinine, Ser: 0.59 mg/dL (ref 0.44–1.00)
Glucose, Bld: 128 mg/dL — ABNORMAL HIGH (ref 65–99)
Potassium: 3.3 mmol/L — ABNORMAL LOW (ref 3.5–5.1)
SODIUM: 139 mmol/L (ref 135–145)

## 2016-04-23 LAB — CBC
HCT: 27.5 % — ABNORMAL LOW (ref 36.0–46.0)
Hemoglobin: 8.4 g/dL — ABNORMAL LOW (ref 12.0–15.0)
MCH: 24.1 pg — ABNORMAL LOW (ref 26.0–34.0)
MCHC: 30.5 g/dL (ref 30.0–36.0)
MCV: 78.8 fL (ref 78.0–100.0)
PLATELETS: 300 10*3/uL (ref 150–400)
RBC: 3.49 MIL/uL — ABNORMAL LOW (ref 3.87–5.11)
RDW: 16.3 % — AB (ref 11.5–15.5)
WBC: 7.3 10*3/uL (ref 4.0–10.5)

## 2016-04-23 LAB — GLUCOSE, CAPILLARY
GLUCOSE-CAPILLARY: 145 mg/dL — AB (ref 65–99)
Glucose-Capillary: 137 mg/dL — ABNORMAL HIGH (ref 65–99)

## 2016-04-23 MED ORDER — POTASSIUM CHLORIDE CRYS ER 20 MEQ PO TBCR
20.0000 meq | EXTENDED_RELEASE_TABLET | Freq: Once | ORAL | Status: AC
Start: 2016-04-23 — End: 2016-04-23
  Administered 2016-04-23: 20 meq via ORAL
  Filled 2016-04-23: qty 1

## 2016-04-23 NOTE — Progress Notes (Signed)
Pt ambulated approx 300 ft with standby assist.  Complains of fatigue in groin area by end of walk, burning in right thigh, already noted by surgeon.  Gait mildly unsteady; however Pt feels secure she is not going to fall, does not wish to use RW.  To bed after walk with daughter present.  Planning for DC home after CM consult.

## 2016-04-23 NOTE — Progress Notes (Signed)
  [facility] Progress Note  Subjective  - POD #5  Denies SOB today. Had some trouble breathing while walking yesterday. States that she has SOB with Brilinta in the past. Brilinta was started yesterday. Tolerating regular diet.   Objective Vitals:   04/22/16 1934 04/23/16 0408  BP: 123/65 (!) 129/59  Pulse: 86 87  Resp: 20 20  Temp: 99.1 F (37.3 C) 98.7 F (37.1 C)   No intake or output data in the 24 hours ending 04/23/16 0810  Lungs clear RRR Abdomen soft, non tender. Incision healing well. Groin incisions clean. Some pubic area swelling. Area is soft.  Palpable DP and PT pulses bilaterally.  Trace edema b/l lower extremities  Assessment/Planning: 60 y.o. female is s/p: aortobifemoral bypass 5 Days Post-Op   Received 20 mg of IV lasix yesterday given SOB with walking. Patient reports history of SOB w/ Brilinta. No SOB this am. Off 02 since last night.  She will discuss if she can stop Brilinta with her cardiologist Dr. Jens Somrenshaw.  Ambulate patient again this am.   Tolerating regular diet.   Hypokalemia: replete.  ABLA: Hgb trending up.   Home today.    Raymond GurneyKimberly A Corlene Sabia 04/23/2016 8:10 AM --  Laboratory CBC    Component Value Date/Time   WBC 7.3 04/23/2016 0252   HGB 8.4 (L) 04/23/2016 0252   HCT 27.5 (L) 04/23/2016 0252   PLT 300 04/23/2016 0252    BMET    Component Value Date/Time   NA 139 04/23/2016 0252   K 3.3 (L) 04/23/2016 0252   CL 102 04/23/2016 0252   CO2 28 04/23/2016 0252   GLUCOSE 128 (H) 04/23/2016 0252   BUN 6 04/23/2016 0252   CREATININE 0.59 04/23/2016 0252   CREATININE 0.69 10/30/2015 1049   CALCIUM 8.9 04/23/2016 0252   GFRNONAA >60 04/23/2016 0252   GFRAA >60 04/23/2016 0252    COAG Lab Results  Component Value Date   INR 0.93 04/11/2016   INR 1.00 10/30/2015   No results found for: PTT  Antibiotics Anti-infectives    Start     Dose/Rate Route Frequency Ordered Stop   04/18/16 2000  cefUROXime  (ZINACEF) 1.5 g in dextrose 5 % 50 mL IVPB     1.5 g 100 mL/hr over 30 Minutes Intravenous Every 12 hours 04/18/16 1542 04/19/16 0954   04/18/16 1215  cefUROXime (ZINACEF) 1.5 g in dextrose 5 % 50 mL IVPB     1.5 g 100 mL/hr over 30 Minutes Intravenous To Surgery 04/18/16 1215 04/19/16 1230   04/18/16 0549  cefUROXime (ZINACEF) 1.5 g in dextrose 5 % 50 mL IVPB     1.5 g 100 mL/hr over 30 Minutes Intravenous 30 min pre-op 04/18/16 0549 04/18/16 1216       Maris BergerKimberly Julanne Schlueter, PA-C [facility] Office: [phone] Pager: [phone] 04/23/2016 8:10 AM

## 2016-04-23 NOTE — Care Management Note (Signed)
Case Management Note [name] RN, BSN Unit 2W-Case Manager [phone]  Patient Details  Name: Debra HickBarbara Dennis MRN: [id number] Date of Birth: 12-17-1955  Subjective/Objective:  Pt admitted  s/p: aortobifemoral bypass                  Action/Plan: PTA pt lived at home- has been started on Brilinta- per insurance check copay cost $75/mo- spoke with pt at bedside- pt states that she has been on Brilinta in the past - however has not on the same insurance and had SOB side effect and did not stay on it. Pt has already used 30 day free card- so is not eligible to use again- however can use the copay assist card- which was given to pt to use on discharge. No further CM needs noted  Expected Discharge Date:     04/23/16             Expected Discharge Plan:  Home/Self Care  In-House Referral:     Discharge planning Services  CM Consult, Medication Assistance  Post Acute Care Choice:    Choice offered to:     DME Arranged:    DME Agency:     HH Arranged:    HH Agency:     Status of Service:  Completed, signed off  If discussed at [employer] Length of Stay Meetings, dates discussed:    Additional Comments:  [name], Debra Gorey Hall, RN 04/23/2016, 10:55 AM

## 2016-04-25 ENCOUNTER — Telehealth: Payer: Self-pay | Admitting: Cardiovascular Disease

## 2016-04-25 NOTE — Telephone Encounter (Signed)
Returned patients call.  Reviewed last office visit.  Read where she was told to continue her aspirin and to discontinue Brilinta. Patient agreed that she was told to do that at her last visit.

## 2016-04-25 NOTE — Telephone Encounter (Signed)
Is to continue Brilinta she was given as hospital--pls call [phone]

## 2016-04-26 NOTE — Telephone Encounter (Signed)
She does not need brilinta from my standpoint. If vascular surgery started, please address question to them. Olga MillersBrian Crenshaw

## 2016-04-26 NOTE — Telephone Encounter (Signed)
It looks like the patient was prescribed Brilinta at surgery discharge.  I recalled patient and advised her to continue taking the Brillinta and I would send this note to her cardiologist to advise her on what she needs to do concerning taking the Brilinta.  Please advise patient.

## 2016-04-28 NOTE — Telephone Encounter (Signed)
Spoke with pt, Aware of dr crenshaw's recommendations.  °

## 2016-05-04 NOTE — Discharge Summary (Signed)
[facility] Discharge Summary  Debra HickBarbara Dennis October 03, 1955 60 y.o. female  [id number]  Admission Date: 04/18/2016  Discharge Date: 04/23/2016  Physician: Durene CalWells Brabham, MD  Admission Diagnosis: Peripheral vascular disease with bilateral lower extremity claudication I70.213  HPI:   This is a 60 y.o. female  who is referred today for evaluation of bilateral claudication. The patient has recently undergone angiography by Dr. Allyson SabalBerry which revealed a 90% ostial calcified left common iliac lesion and an occluded right common and external iliac artery. The patient had three-vessel runoff bilaterally. She was felt to not be a good candidate for percutaneous revascularization and therefore was referred for surgical evaluation. The patient reports that she gets symptoms after walking approximately 100 feet. Her right leg bothers were the same as the left. She states that her legs feel tight and feel like they're going to give out. She denies ulcers or rest pain.  The patient has a significant coronary artery history. 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 She suffers from hypercholesterolemia which is managed with a statin. She is on dual antiplatelet therapy. She continues to be a half a pack a day smoker. She has undergone ablation for trigeminal neuralgia.  The patient has an abdominal surgical history consisting of appendectomy and tubal ligation  She reports no changes since Dr. Myra GianottiBrabham last saw her.   No neurologic symptoms.  Claudication is stable.  No ulcers.  Hospital Course:  The patient was admitted to the hospital and taken to the operating room on 04/18/2016 and underwent: aortobifemoral bypass  The patient tolerated the procedure well and was transported to the PACU in stable condition.   The patient did well post-operatively. She was transferred out of the ICU on POD 1. Her NG tube was  discontinued on POD 2. She was started on clear liquids on POD 3. Her diet was slowly advanced and she tolerated this well. By POD 5, she was tolerating a regular diet and had BM. Her Brilinta was restarted on POD 4. The patient did complain of some SOB and required 2L of oxygen. She reported that she had similar episodes in the past with Brilinta. This was discontinued immediately and the patient's SOB resolved overnight. By POD 5, she was comfortable and was discharged home. She had palpable pedal pulses and her incisions were clean and intact. [facility]   CBC    Component Value Date/Time   WBC 7.3 04/23/2016 0252   RBC 3.49 (L) 04/23/2016 0252   HGB 8.4 (L) 04/23/2016 0252   HCT 27.5 (L) 04/23/2016 0252   PLT 300 04/23/2016 0252   MCV 78.8 04/23/2016 0252   MCH 24.1 (L) 04/23/2016 0252   MCHC 30.5 04/23/2016 0252   RDW 16.3 (H) 04/23/2016 0252   LYMPHSABS 2,225 10/30/2015 1049   MONOABS 801 10/30/2015 1049   EOSABS 178 10/30/2015 1049   BASOSABS 0 10/30/2015 1049    BMET    Component Value Date/Time   NA 139 04/23/2016 0252   K 3.3 (L) 04/23/2016 0252   CL 102 04/23/2016 0252   CO2 28 04/23/2016 0252   GLUCOSE 128 (H) 04/23/2016 0252   BUN 6 04/23/2016 0252   CREATININE 0.59 04/23/2016 0252   CREATININE 0.69 10/30/2015 1049   CALCIUM 8.9 04/23/2016 0252   GFRNONAA >60 04/23/2016 0252   GFRAA >60 04/23/2016 0252     Discharge Instructions:  The patient is discharged to home with extensive instructions on wound care and progressive ambulation. They are instructed not to drive or perform any heavy lifting until returning to see the physician in his office.  Discharge Instructions    ABDOMINAL PROCEDURE/ANEURYSM REPAIR/AORTO-BIFEMORAL BYPASS:  Call MD for increased abdominal pain; cramping diarrhea; nausea/vomiting    Complete by:  As directed    Call MD for:  redness, tenderness, or signs of infection (pain, swelling,  bleeding, redness, odor or green/yellow discharge around incision site)    Complete by:  As directed    Call MD for:  severe or increased pain, loss or decreased feeling  in affected limb(s)    Complete by:  As directed    Call MD for:  temperature >100.5    Complete by:  As directed    Discharge wound care:    Complete by:  As directed    Wash the groin wounds with soap and water daily and pat dry. (No tub bath-only shower)  Then put a dry gauze or washcloth there to keep this area dry daily and as needed.  Do not use Vaseline or neosporin on your incisions.  Only use soap and water on your incisions and then protect and keep dry.  Wash abdominal wound gently with soap and water and pat dry.  Do not pull off the skin glue on your incisions. It will come off on its own.   Driving Restrictions    Complete by:  As directed    No driving for 1 week   Increase activity slowly    Complete by:  As directed    Walk with assistance use walker or cane as needed   Lifting restrictions    Complete by:  As directed    No heavy lifting for 2 weeks   Resume previous diet    Complete by:  As directed       Discharge Diagnosis:  Peripheral vascular disease with bilateral lower extremity claudication I70.213  Secondary Diagnosis: Patient Active Problem List   Diagnosis Date Noted  . Aortoiliac occlusive disease (HCC) 04/18/2016  . Bilateral carotid artery disease (HCC) 10/30/2015  . Coronary artery disease 09/21/2015  . Tobacco abuse 09/21/2015  . Claudication (HCC) 09/21/2015  . Essential hypertension 09/21/2015  . Hyperlipidemia 09/21/2015  . Bruit 09/21/2015   Past Medical History:  Diagnosis Date  . Bilateral carotid artery disease (HCC)   . CAD (coronary artery disease)   . Claudication (HCC)   . Diabetes mellitus (HCC)   . GERD (gastroesophageal reflux disease)   . Hyperlipidemia   . Hypertension   . Myocardial infarction   . Restless leg   . Shingles 2011   Stress shingles    . TIA (transient ischemic attack)    X 2       Medication List    TAKE these medications   amLODipine 5 MG tablet Commonly known as:  NORVASC Take 5 mg by mouth daily.   Armodafinil 250 MG tablet Take 250 mg by mouth daily as needed. For night shift.   artificial tears Oint ophthalmic ointment Place 1 application into the left eye at bedtime as needed (for dry/red/irritated eyes.).   aspirin EC 81 MG tablet Take 81 mg by mouth daily.   buPROPion 150 MG 12 hr tablet Commonly known as:  WELLBUTRIN SR Take 150 mg by mouth at bedtime.   calcium-vitamin D 500-200 MG-UNIT tablet Take 1 tablet by mouth 2 (two) times daily.  CLARITIN 10 MG Caps Generic drug:  Loratadine Take 10 mg by mouth daily.   COLACE PO Take 100 mg by mouth 2 (two) times daily.   Fish Oil 1000 MG Caps Take 1 capsule by mouth 2 (two) times daily.   hydroxypropyl methylcellulose / hypromellose 2.5 % ophthalmic solution Commonly known as:  ISOPTO TEARS / GONIOVISC Place 1 drop into the left eye 3 (three) times daily as needed for dry eyes.   metFORMIN 500 MG tablet Commonly known as:  GLUCOPHAGE Take 500 mg by mouth 2 (two) times daily.   multivitamin with minerals Tabs tablet Take 1 tablet by mouth daily.   nitroGLYCERIN 0.4 MG SL tablet Commonly known as:  NITROSTAT Place 1 tablet under the tongue every 5 (five) minutes as needed for chest pain.   omeprazole 20 MG capsule Commonly known as:  PRILOSEC Take 20 mg by mouth 2 (two) times daily.   oxyCODONE-acetaminophen 5-325 MG tablet Commonly known as:  PERCOCET/ROXICET Take 1 tablet by mouth every 6 (six) hours as needed for moderate pain.   rOPINIRole 0.25 MG tablet Commonly known as:  REQUIP Take 0.25 mg by mouth 3 (three) times daily as needed. For leg cramps/restless legs.   rosuvastatin 40 MG tablet Commonly known as:  CRESTOR Take 1 tablet (40 mg total) by mouth daily.   ticagrelor 90 MG Tabs tablet Commonly known as:   BRILINTA Take 1 tablet (90 mg total) by mouth 2 (two) times daily.      Percocet #30 No Refill  Disposition: Home  Patient's condition: is Good  Follow up: 1. Dr. Myra Gianotti in 2 weeks   Maris Berger, PA-C [facility] [phone] 05/04/2016  9:58 PM   - For VQI Registry use ---   Post-op:  Time to Extubation: [x ] In OR, [ ]  < 12 hrs, [ ]  12-24 hrs, [ ]  >=24 hrs Vasopressors Req. Post-op: No ICU Stay: 1 days Transfusion: No  If yes, 0 units given MI: No, [ ]  Troponin only, [ ]  EKG or Clinical New Arrhythmia: No  Complications: CHF: No Resp failure: No, [ ]  Pneumonia, [ ]  Ventilator Chg in renal function: No, [ ]  Inc. Cr > 0.5, [ ]  Temp. Dialysis, [ ]  Permanent dialysis Leg ischemia: No, no Surgery needed, [ ]  Yes, Surgery needed, [ ]  Amputation Bowel ischemia: No, [ ]  Medical Rx, [ ]  Surgical Rx Wound complication: No, [ ]  Superficial separation/infection, [ ]  Return to OR Return to OR: No  Return to OR for bleeding: No Stroke: No, [ ]  Minor, [ ]  Major  Discharge medications: Statin use:  Yes If No: [ ]  For Medical reasons, [ ]  Non-compliant ASA use:  Yes  If No: [ ]  For Medical reasons, [ ]  Non-compliant Plavix use:  No If No: [x ] For Medical reasons, [ ]  Non-compliant Beta blocker use:  No If No: [x ] For Medical reasons, [ ]  Non-compliant

## 2016-05-07 ENCOUNTER — Encounter: Payer: Self-pay | Admitting: Surgery

## 2016-05-12 ENCOUNTER — Encounter: Payer: Self-pay | Admitting: Surgery

## 2016-05-12 ENCOUNTER — Ambulatory Visit (INDEPENDENT_AMBULATORY_CARE_PROVIDER_SITE_OTHER): Payer: Self-pay | Admitting: Surgery

## 2016-05-12 VITALS — BP 126/74 | HR 100 | Temp 100.1°F | Resp 18 | Ht 62.0 in | Wt 161.9 lb

## 2016-05-12 DIAGNOSIS — T8140XA Infection following a procedure, unspecified, initial encounter: Secondary | ICD-10-CM

## 2016-05-12 DIAGNOSIS — I70213 Atherosclerosis of native arteries of extremities with intermittent claudication, bilateral legs: Secondary | ICD-10-CM

## 2016-05-12 DIAGNOSIS — T814XXA Infection following a procedure, initial encounter: Secondary | ICD-10-CM

## 2016-05-12 MED ORDER — OXYCODONE-ACETAMINOPHEN 5-325 MG PO TABS: 1.0000 | ORAL_TABLET | ORAL | 0 refills | Status: DC | PRN

## 2016-05-12 MED ORDER — CEPHALEXIN 500 MG PO CAPS: 500.0000 mg | ORAL_CAPSULE | Freq: Three times a day (TID) | ORAL | 0 refills | Status: DC

## 2016-05-12 NOTE — Progress Notes (Signed)
Patient name: Debra HickBarbara Rondeau MRN: [id number] DOB: 01/21/1956 Sex: female  REASON FOR VISIT: post-op  HPI: Debra Dennis is a 60 y.o. female who is status post aortobifemoral bypass graft on 04/18/2016 for claudication.  Her postoperative course was uncomplicated.  She is back today for follow-up.  She is complaining of some pain in her right groin and a little bit of drainage from the past day.  She has had some constipation.  Her diet is stable.  She is taking minimal pain medication.  Current Outpatient Prescriptions  Medication Sig Dispense Refill  . amLODipine (NORVASC) 5 MG tablet Take 5 mg by mouth daily.    . Armodafinil 250 MG tablet Take 250 mg by mouth daily as needed. For night shift.    Marland Kitchen. artificial tears (LACRILUBE) OINT ophthalmic ointment Place 1 application into the left eye at bedtime as needed (for dry/red/irritated eyes.).    Marland Kitchen. aspirin EC 81 MG tablet Take 81 mg by mouth daily.    Marland Kitchen. buPROPion (WELLBUTRIN SR) 150 MG 12 hr tablet Take 150 mg by mouth at bedtime.     . Calcium Carbonate-Vitamin D (CALCIUM-VITAMIN D) 500-200 MG-UNIT tablet Take 1 tablet by mouth 2 (two) times daily.    Tery Sanfilippo. Docusate Sodium (COLACE PO) Take 100 mg by mouth 2 (two) times daily.     . hydroxypropyl methylcellulose / hypromellose (ISOPTO TEARS / GONIOVISC) 2.5 % ophthalmic solution Place 1 drop into the left eye 3 (three) times daily as needed for dry eyes.    . Loratadine (CLARITIN) 10 MG CAPS Take 10 mg by mouth daily.    . metFORMIN (GLUCOPHAGE) 500 MG tablet Take 500 mg by mouth 2 (two) times daily.    . Multiple Vitamin (MULTIVITAMIN WITH MINERALS) TABS tablet Take 1 tablet by mouth daily.    . nitroGLYCERIN (NITROSTAT) 0.4 MG SL tablet Place 1 tablet under the tongue every 5 (five) minutes as needed for chest pain.     . Omega-3 Fatty Acids (FISH OIL) 1000 MG CAPS Take 1 capsule by mouth 2 (two) times daily.    Marland Kitchen. omeprazole (PRILOSEC) 20 MG capsule Take 20 mg by  mouth 2 (two) times daily.    Marland Kitchen. rOPINIRole (REQUIP) 0.25 MG tablet Take 0.25 mg by mouth 3 (three) times daily as needed. For leg cramps/restless legs.    . rosuvastatin (CRESTOR) 40 MG tablet Take 1 tablet (40 mg total) by mouth daily. 90 tablet 3  . cephALEXin (KEFLEX) 500 MG capsule Take 1 capsule (500 mg total) by mouth 3 (three) times daily. 21 capsule 0  . oxyCODONE-acetaminophen (PERCOCET/ROXICET) 5-325 MG tablet Take 1 tablet by mouth every 4 (four) hours as needed for severe pain. 15 tablet 0  . ticagrelor (BRILINTA) 90 MG TABS tablet Take 1 tablet (90 mg total) by mouth 2 (two) times daily. (Patient not taking: Reported on 05/12/2016) 60 tablet 1   No current facility-administered medications for this visit.     REVIEW OF SYSTEMS:  [X]  denotes positive finding, [ ]  denotes negative finding Cardiac  Comments:  Chest pain or chest pressure:    Shortness of breath upon exertion:    Short of breath when lying flat:    Irregular heart rhythm:    Constitutional    Fever or chills:      PHYSICAL EXAM: Vitals:   05/12/16 0901  BP: 126/74  Pulse: 100  Resp: 18  Temp: 100.1 F (37.8 C)  TempSrc: Oral  SpO2: 98%  Weight: 161 lb  14.4 oz (73.4 kg)  Height: 5\' 2"  (1.575 m)    GENERAL: The patient is a well-nourished female, in no acute distress. The vital signs are documented above. CARDIOVASCULAR: There is a regular rate and rhythm. PULMONARY: There is good air exchange bilaterally without wheezing or rales. There is erythema in the right groin.  I probed this oblique incision with a Q-tip.  There was a small stitch abscess.  Purulent material was evacuated.  This was a very superficial process.  Midline incision is well-healed without hernia  Palpable dorsalis pedis pulses bilaterally  MEDICAL ISSUES: I am placing the patient on Keflex for 1 week for superficial stitch abscess.  I'm giving her another 15 Percocet tablets.  Overall she is doing very well I am pleased with  her progress.  I have her scheduled to follow up again in 1 month.  Durene CalWells Abanoub Hanken, MD [facility] Tel [phone]) [phone] Pager [phone]) [phone]

## 2016-06-11 ENCOUNTER — Encounter: Payer: Self-pay | Admitting: Surgery

## 2016-06-16 ENCOUNTER — Ambulatory Visit (INDEPENDENT_AMBULATORY_CARE_PROVIDER_SITE_OTHER): Payer: Self-pay | Admitting: Surgery

## 2016-06-16 ENCOUNTER — Encounter: Payer: Self-pay | Admitting: Surgery

## 2016-06-16 ENCOUNTER — Other Ambulatory Visit: Payer: Self-pay

## 2016-06-16 VITALS — BP 123/70 | HR 93 | Temp 99.2°F | Resp 16 | Ht 62.0 in | Wt 165.2 lb

## 2016-06-16 DIAGNOSIS — I70213 Atherosclerosis of native arteries of extremities with intermittent claudication, bilateral legs: Secondary | ICD-10-CM

## 2016-06-16 DIAGNOSIS — I6529 Occlusion and stenosis of unspecified carotid artery: Secondary | ICD-10-CM

## 2016-06-16 NOTE — Progress Notes (Signed)
Patient name: Debra HickBarbara Hellen MRN: [id number] DOB: [birth year] Sex: female  REASON FOR VISIT: post-op  HPI: Debra Dennis is a 60 y.o. female who is status post aortobifemoral bypass graft on 04/18/2016 for claudication.  Her postoperative course was uncomplicated.  She is back today for follow-up.  She is making excellent progress.  She denies any symptoms of claudication.  She does complain of fatigue.  She is eager to get back to work.  Current Outpatient Prescriptions  Medication Sig Dispense Refill  . amLODipine (NORVASC) 5 MG tablet Take 5 mg by mouth daily.    . Armodafinil 250 MG tablet Take 250 mg by mouth daily as needed. For night shift.    Marland Kitchen. artificial tears (LACRILUBE) OINT ophthalmic ointment Place 1 application into the left eye at bedtime as needed (for dry/red/irritated eyes.).    Marland Kitchen. aspirin EC 81 MG tablet Take 81 mg by mouth daily.    Marland Kitchen. buPROPion (WELLBUTRIN SR) 150 MG 12 hr tablet Take 150 mg by mouth at bedtime.     . Calcium Carbonate-Vitamin D (CALCIUM-VITAMIN D) 500-200 MG-UNIT tablet Take 1 tablet by mouth 2 (two) times daily.    Tery Sanfilippo. Docusate Sodium (COLACE PO) Take 100 mg by mouth 2 (two) times daily.     . hydroxypropyl methylcellulose / hypromellose (ISOPTO TEARS / GONIOVISC) 2.5 % ophthalmic solution Place 1 drop into the left eye 3 (three) times daily as needed for dry eyes.    . Loratadine (CLARITIN) 10 MG CAPS Take 10 mg by mouth daily.    . metFORMIN (GLUCOPHAGE) 500 MG tablet Take 500 mg by mouth 2 (two) times daily.    . Multiple Vitamin (MULTIVITAMIN WITH MINERALS) TABS tablet Take 1 tablet by mouth daily.    . nitroGLYCERIN (NITROSTAT) 0.4 MG SL tablet Place 1 tablet under the tongue every 5 (five) minutes as needed for chest pain.     . Omega-3 Fatty Acids (FISH OIL) 1000 MG CAPS Take 1 capsule by mouth 2 (two) times daily.    Marland Kitchen. omeprazole (PRILOSEC) 20 MG capsule Take 20 mg by mouth 2 (two) times daily.    Marland Kitchen. rOPINIRole (REQUIP)  0.25 MG tablet Take 0.25 mg by mouth 3 (three) times daily as needed. For leg cramps/restless legs.    . rosuvastatin (CRESTOR) 40 MG tablet Take 1 tablet (40 mg total) by mouth daily. 90 tablet 3  . cephALEXin (KEFLEX) 500 MG capsule Take 1 capsule (500 mg total) by mouth 3 (three) times daily. (Patient not taking: Reported on 06/16/2016) 21 capsule 0  . oxyCODONE-acetaminophen (PERCOCET/ROXICET) 5-325 MG tablet Take 1 tablet by mouth every 4 (four) hours as needed for severe pain. (Patient not taking: Reported on 06/16/2016) 15 tablet 0  . ticagrelor (BRILINTA) 90 MG TABS tablet Take 1 tablet (90 mg total) by mouth 2 (two) times daily. (Patient not taking: Reported on 06/16/2016) 60 tablet 1   No current facility-administered medications for this visit.     REVIEW OF SYSTEMS:  [X]  denotes positive finding, [ ]  denotes negative finding Cardiac  Comments:  Chest pain or chest pressure:    Shortness of breath upon exertion:    Short of breath when lying flat:    Irregular heart rhythm:    Constitutional    Fever or chills:      PHYSICAL EXAM: Vitals:   06/16/16 0855  BP: 123/70  Pulse: 93  Resp: 16  Temp: 99.2 F (37.3 C)  TempSrc: Oral  SpO2: 97%  Weight: 165  lb 3.2 oz (74.9 kg)  Height: 5\' 2"  (1.575 m)    GENERAL: The patient is a well-nourished female, in no acute distress. The vital signs are documented above. CARDIOVASCULAR: There is a regular rate and rhythm. PULMONARY: Nonlabored respirations Midline incision is well-healed without evidence of hernia. Bilateral inguinal incisions are well-healed.  She has palpable femoral and pedal pulses  MEDICAL ISSUES: She is doing very well at this time.  I have cleared her to return to work on January 5.  She is scheduled to follow up in 6 months for repeat carotid duplex.  Durene CalWells Evalin Shawhan, MD [facility] Tel [phone]) [phone] Pager [phone]) [phone]

## 2016-10-02 ENCOUNTER — Other Ambulatory Visit: Payer: Self-pay | Admitting: Cardiology

## 2016-10-02 DIAGNOSIS — I2510 Atherosclerotic heart disease of native coronary artery without angina pectoris: Secondary | ICD-10-CM

## 2016-12-10 ENCOUNTER — Encounter: Payer: Self-pay | Admitting: Surgery

## 2016-12-15 ENCOUNTER — Ambulatory Visit (INDEPENDENT_AMBULATORY_CARE_PROVIDER_SITE_OTHER): Payer: BLUE CROSS/BLUE SHIELD | Admitting: Surgery

## 2016-12-15 ENCOUNTER — Encounter: Payer: Self-pay | Admitting: Surgery

## 2016-12-15 ENCOUNTER — Ambulatory Visit (HOSPITAL_COMMUNITY)
Admission: RE | Admit: 2016-12-15 | Discharge: 2016-12-15 | Disposition: A | Discharge status: Home / Self Care | Payer: BLUE CROSS/BLUE SHIELD | Source: Ambulatory Visit | Attending: Surgery | Admitting: Surgery

## 2016-12-15 VITALS — BP 155/79 | HR 82 | Temp 98.6°F | Resp 18 | Ht 62.0 in | Wt 165.0 lb

## 2016-12-15 DIAGNOSIS — I70213 Atherosclerosis of native arteries of extremities with intermittent claudication, bilateral legs: Secondary | ICD-10-CM

## 2016-12-15 DIAGNOSIS — I7409 Other arterial embolism and thrombosis of abdominal aorta: Secondary | ICD-10-CM

## 2016-12-15 DIAGNOSIS — I6523 Occlusion and stenosis of bilateral carotid arteries: Secondary | ICD-10-CM | POA: Insufficient documentation

## 2016-12-15 DIAGNOSIS — I6529 Occlusion and stenosis of unspecified carotid artery: Secondary | ICD-10-CM

## 2016-12-15 LAB — VAS US CAROTID
LCCADSYS: -83 cm/s
LCCAPDIAS: 21 cm/s
LCCAPSYS: 85 cm/s
LEFT ECA DIAS: -41 cm/s
LICAPDIAS: -50 cm/s
Left CCA dist dias: -23 cm/s
Left ICA prox sys: -180 cm/s
RCCAPDIAS: 18 cm/s
RCCAPSYS: 101 cm/s
RIGHT CCA MID DIAS: 26 cm/s
RIGHT ECA DIAS: 20 cm/s
RIGHT VERTEBRAL DIAS: 15 cm/s
Right cca dist sys: 100 cm/s

## 2016-12-15 NOTE — Progress Notes (Signed)
[facility]  Patient name: Debra HickBarbara Dennis MRN: [id number] DOB: Aug 16, 1955 Sex: female   REASON FOR VISIT:    Follow up AB[facility]  HISOTRY OF PRESENT ILLNESS:    Debra HickBarbara Dennis is a 61 y.o. female who returns today for follow-up.  She is status post aortobifemoral bypass graft on 04/18/2016.  This was done for claudication.  Her postoperative course was uncomplicated.  She is now back at work.  She denies any symptoms of claudication.  She also has not had any neurologic symptoms.  She continues to take a statin for hypercholesterolemia.  She is medically managed for hypertension.  She works hard on her diabetes.  She continues to smoke.   PAST MEDICAL HISTORY:   Past Medical History:  Diagnosis Date  . Bilateral carotid artery disease (HCC)   . CAD (coronary artery disease)   . Claudication (HCC)   . Diabetes mellitus (HCC)   . GERD (gastroesophageal reflux disease)   . Hyperlipidemia   . Hypertension   . Myocardial infarction (HCC)   . Restless leg   . Shingles 2011   Stress shingles   . TIA (transient ischemic attack)    X 2     FAMILY HISTORY:   Family History  Problem Relation Age of Onset  . CAD Son   . CAD Father     SOCIAL HISTORY:   Social History  Substance Use Topics  . Smoking status: Current Some Day Smoker    Packs/day: 0.50    Years: 40.00    Types: Cigarettes  . Smokeless tobacco: Former NeurosurgeonUser  . Alcohol use No     ALLERGIES:   Allergies  Allergen Reactions  . Carbamazepine Other (See Comments)    ABNORMAL LABS ELEVATION OF LIVER ENZYMES   . Dilantin [Phenytoin] Hives  . Metoprolol Other (See Comments)    "Cause my heart to pause"     CURRENT MEDICATIONS:   Current Outpatient Prescriptions  Medication Sig Dispense Refill  . amLODipine (NORVASC) 5 MG tablet Take 5 mg by mouth daily.    . Armodafinil 250 MG tablet Take 250 mg by mouth daily as needed. For night shift.      Marland Kitchen. artificial tears (LACRILUBE) OINT ophthalmic ointment Place 1 application into the left eye at bedtime as needed (for dry/red/irritated eyes.).    Marland Kitchen. aspirin EC 81 MG tablet Take 81 mg by mouth daily.    Marland Kitchen. buPROPion (WELLBUTRIN SR) 150 MG 12 hr tablet Take 150 mg by mouth at bedtime.     . Calcium Carbonate-Vitamin D (CALCIUM-VITAMIN D) 500-200 MG-UNIT tablet Take 1 tablet by mouth 2 (two) times daily.    Tery Sanfilippo. Docusate Sodium (COLACE PO) Take 100 mg by mouth 2 (two) times daily.     . hydroxypropyl methylcellulose / hypromellose (ISOPTO TEARS / GONIOVISC) 2.5 % ophthalmic solution Place 1 drop into the left eye 3 (three) times daily as needed for dry eyes.    . Loratadine (CLARITIN) 10 MG CAPS Take 10 mg by mouth daily.    . metFORMIN (GLUCOPHAGE) 500 MG tablet Take 500 mg by mouth 2 (two) times daily.    . Multiple Vitamin (MULTIVITAMIN WITH MINERALS) TABS tablet Take 1 tablet by mouth daily.    . nitroGLYCERIN (NITROSTAT) 0.4 MG SL tablet Place 1 tablet under the tongue every 5 (five) minutes as needed for chest pain.     . Omega-3 Fatty Acids (FISH OIL) 1000 MG CAPS Take 1 capsule by mouth 2 (  two) times daily.    Marland Kitchen omeprazole (PRILOSEC) 20 MG capsule Take 20 mg by mouth 2 (two) times daily.    Marland Kitchen rOPINIRole (REQUIP) 0.25 MG tablet Take 0.25 mg by mouth 3 (three) times daily as needed. For leg cramps/restless legs.    . rosuvastatin (CRESTOR) 40 MG tablet Take 1 tablet (40 mg total) by mouth daily. 30 tablet 6  . cephALEXin (KEFLEX) 500 MG capsule Take 1 capsule (500 mg total) by mouth 3 (three) times daily. (Patient not taking: Reported on 06/16/2016) 21 capsule 0  . oxyCODONE-acetaminophen (PERCOCET/ROXICET) 5-325 MG tablet Take 1 tablet by mouth every 4 (four) hours as needed for severe pain. (Patient not taking: Reported on 12/15/2016) 15 tablet 0  . ticagrelor (BRILINTA) 90 MG TABS tablet Take 1 tablet (90 mg total) by mouth 2 (two) times daily. (Patient not taking: Reported on 12/15/2016) 60  tablet 1   No current facility-administered medications for this visit.     REVIEW OF SYSTEMS:   [X]  denotes positive finding, [ ]  denotes negative finding Cardiac  Comments:  Chest pain or chest pressure:    Shortness of breath upon exertion: x   Short of breath when lying flat:    Irregular heart rhythm:        Vascular    Pain in calf, thigh, or hip brought on by ambulation:    Pain in feet at night that wakes you up from your sleep:     Blood clot in your veins:    Leg swelling:         Pulmonary    Oxygen at home:    Productive cough:     Wheezing:         Neurologic    Sudden weakness in arms or legs:     Sudden numbness in arms or legs:     Sudden onset of difficulty speaking or slurred speech:    Temporary loss of vision in one eye:     Problems with dizziness:         Gastrointestinal    Blood in stool:     Vomited blood:         Genitourinary    Burning when urinating:     Blood in urine:        Psychiatric    Major depression:         Hematologic    Bleeding problems:    Problems with blood clotting too easily:        Skin    Rashes or ulcers:        Constitutional    Fever or chills:      PHYSICAL EXAM:   Vitals:   12/15/16 0939 12/15/16 0941  BP: (!) 145/78 (!) 155/79  Pulse: 82   Resp: 18   Temp: 98.6 F (37 C)   TempSrc: Oral   SpO2: 97%   Weight: 165 lb (74.8 kg)   Height: 5\' 2"  (1.575 m)     GENERAL: The patient is a well-nourished female, in no acute distress. The vital signs are documented above. CARDIAC: There is a regular rate and rhythm.  VASCULAR: Palpable posterior tibial pulses bilaterally PULMONARY: Non-labored respirations ABDOMEN: Soft and non-tender.  Midline incision is well-healed with no evidence of hernia MUSCULOSKELETAL: There are no major deformities or cyanosis. NEUROLOGIC: No focal weakness or paresthesias are detected. SKIN: There are no ulcers or rashes noted. PSYCHIATRIC: The patient has a normal  affect.  STUDIES:   I  have ordered and reviewed her carotid duplex.  This shows a stable 60-79% left carotid stenosis and 40-59% right carotid stenosis  MEDICAL ISSUES:   Status post aortobifemoral bypass graft: The patient's claudication symptoms have resolved.  She has no residual issues.  Her incisions have healed nicely.  I will have her follow-up in 3 years for an aortoiliac duplex to check the proximal and distal attachment sites.  Carotid stenosis: The patient is asymptomatic.  She has a stable 60-79% left carotid stenosis.  She will follow-up for routine carotid surveillance.    Durene Cal, MD [facility] [phone] Pager [phone]

## 2016-12-26 NOTE — Addendum Note (Signed)
Addended by: Burton ApleyPETTY, Roann Merk A on: 12/26/2016 11:18 AM   Modules accepted: Orders

## 2017-12-21 ENCOUNTER — Encounter: Payer: Self-pay | Admitting: Family

## 2017-12-21 ENCOUNTER — Ambulatory Visit (HOSPITAL_COMMUNITY)
Admission: RE | Admit: 2017-12-21 | Discharge: 2017-12-21 | Disposition: A | Discharge status: Home / Self Care | Payer: No Typology Code available for payment source | Source: Ambulatory Visit | Attending: Surgery | Admitting: Surgery

## 2017-12-21 ENCOUNTER — Ambulatory Visit (INDEPENDENT_AMBULATORY_CARE_PROVIDER_SITE_OTHER): Payer: No Typology Code available for payment source | Admitting: Family

## 2017-12-21 VITALS — BP 125/81 | HR 84 | Temp 98.6°F | Resp 18 | Ht 62.0 in | Wt 164.0 lb

## 2017-12-21 DIAGNOSIS — Z95828 Presence of other vascular implants and grafts: Secondary | ICD-10-CM

## 2017-12-21 DIAGNOSIS — I7409 Other arterial embolism and thrombosis of abdominal aorta: Secondary | ICD-10-CM | POA: Diagnosis not present

## 2017-12-21 DIAGNOSIS — F17200 Nicotine dependence, unspecified, uncomplicated: Secondary | ICD-10-CM | POA: Diagnosis not present

## 2017-12-21 DIAGNOSIS — I6523 Occlusion and stenosis of bilateral carotid arteries: Secondary | ICD-10-CM

## 2017-12-21 NOTE — Progress Notes (Addendum)
Chief Complaint: Follow up Extracranial Carotid Artery Stenosis   History of Present Illness  Debra Dennis is a 62 y.o. female who is status post aortobifemoral bypass graft on 04/18/2016 by Dr. Myra Gianotti.  This was done for claudication.  Her postoperative course was uncomplicated.  She also has carotid artery stenosis we are monitoring.  She had what sounds like 2 TIA's close together in the 1990's as manifested by slurred speech and loss of lateral half of vision of one eye, does not remember which eye. No subsequent events. She states she was very hypertensive at that time.   She is back at work.  She denies any symptoms of claudication.  She also has not had any neurologic symptoms.  She continues to take a statin for hypercholesterolemia.  She is medically managed for hypertension.  She works hard on her diabetes.  She continues to smoke.   Dr. Myra Gianotti last evaluated pt on 12-15-16. At that time her claudication symptoms had resolved.  She had no residual issues.  Her incisions had healed nicely.  Pt was to follow-up in 3 years for an aortoiliac duplex to check the proximal and distal attachment sites. Carotid stenosis: The patient was asymptomatic.  She had a stable 60-79% left carotid stenosis.  She was to follow-up for routine carotid surveillance.  Patient has not had previous carotid artery intervention.   Diabetic: yes, states in good control  Tobacco use: smoker  (1 ppd x 40 yrs)  Pt meds include: Statin : yes ASA: yes Other anticoagulants/antiplatelets: no longer taking Brilinta on advice    Past Medical History:  Diagnosis Date  . Bilateral carotid artery disease (HCC)   . CAD (coronary artery disease)   . Claudication (HCC)   . Diabetes mellitus (HCC)   . GERD (gastroesophageal reflux disease)   . Hyperlipidemia   . Hypertension   . Myocardial infarction (HCC)   . Restless leg   . Shingles 2011   Stress shingles   . TIA (transient ischemic attack)    X 2     Social History Social History   Tobacco Use  . Smoking status: Current Some Day Smoker    Packs/day: 0.50    Years: 40.00    Pack years: 20.00    Types: Cigarettes  . Smokeless tobacco: Former Engineer, water Use Topics  . Alcohol use: No    Alcohol/week: 0.0 oz  . Drug use: No    Family History Family History  Problem Relation Age of Onset  . CAD Son   . CAD Father     Surgical History Past Surgical History:  Procedure Laterality Date  . AORTA - BILATERAL FEMORAL ARTERY BYPASS GRAFT Bilateral 04/18/2016   Procedure: AORTOBIFEMORAL BYPASS GRAFT;  Surgeon: Nada Libman, MD;  Location: [facility] OR;  Service: Vascular;  Laterality: Bilateral;  . APPENDECTOMY    . CARDIAC CATHETERIZATION    . COLONOSCOPY    . CRANIOTOMY    . CYST EXCISION PERINEAL    . HEEL SPUR RESECTION    . PERIPHERAL VASCULAR CATHETERIZATION Bilateral 11/05/2015   Procedure: Lower Extremity Angiography;  Surgeon: Runell Gess, MD;  Location: [facility] INVASIVE CV LAB;  Service: Cardiovascular;  Laterality: Bilateral;  . PERIPHERAL VASCULAR CATHETERIZATION N/A 11/05/2015   Procedure: Abdominal Aortogram;  Surgeon: Runell Gess, MD;  Location: [facility] INVASIVE CV LAB;  Service: Cardiovascular;  Laterality: N/A;  . TUBAL LIGATION      Allergies  Allergen Reactions  . Carbamazepine Other (See  Comments)    ABNORMAL LABS ELEVATION OF LIVER ENZYMES   . Dilantin [Phenytoin] Hives  . Metoprolol Other (See Comments)    "Cause my heart to pause"    Current Outpatient Medications  Medication Sig Dispense Refill  . Armodafinil 250 MG tablet Take 250 mg by mouth daily as needed. For night shift.    Marland Kitchen artificial tears (LACRILUBE) OINT ophthalmic ointment Place 1 application into the left eye at bedtime as needed (for dry/red/irritated eyes.).    Marland Kitchen aspirin EC 81 MG tablet Take 81 mg by mouth daily.    Marland Kitchen buPROPion (WELLBUTRIN SR) 150 MG 12 hr tablet Take 150 mg by mouth at bedtime.     . Calcium Carbonate-Vitamin  D (CALCIUM-VITAMIN D) 500-200 MG-UNIT tablet Take 1 tablet by mouth 2 (two) times daily.    Tery Sanfilippo Sodium (COLACE PO) Take 100 mg by mouth 2 (two) times daily.     . hydroxypropyl methylcellulose / hypromellose (ISOPTO TEARS / GONIOVISC) 2.5 % ophthalmic solution Place 1 drop into the left eye 3 (three) times daily as needed for dry eyes.    . Loratadine (CLARITIN) 10 MG CAPS Take 10 mg by mouth daily.    . Multiple Vitamin (MULTIVITAMIN WITH MINERALS) TABS tablet Take 1 tablet by mouth daily.    . Omega-3 Fatty Acids (FISH OIL) 1000 MG CAPS Take 1 capsule by mouth 2 (two) times daily.    Marland Kitchen omeprazole (PRILOSEC) 20 MG capsule Take 20 mg by mouth 2 (two) times daily.    Marland Kitchen rOPINIRole (REQUIP) 0.25 MG tablet Take 0.25 mg by mouth 3 (three) times daily as needed. For leg cramps/restless legs.    . rosuvastatin (CRESTOR) 40 MG tablet Take 1 tablet (40 mg total) by mouth daily. 30 tablet 6  . amLODipine (NORVASC) 5 MG tablet Take 5 mg by mouth daily.    . cephALEXin (KEFLEX) 500 MG capsule Take 1 capsule (500 mg total) by mouth 3 (three) times daily. (Patient not taking: Reported on 06/16/2016) 21 capsule 0  . metFORMIN (GLUCOPHAGE) 500 MG tablet Take 500 mg by mouth 2 (two) times daily.    . nitroGLYCERIN (NITROSTAT) 0.4 MG SL tablet Place 1 tablet under the tongue every 5 (five) minutes as needed for chest pain.     Marland Kitchen oxyCODONE-acetaminophen (PERCOCET/ROXICET) 5-325 MG tablet Take 1 tablet by mouth every 4 (four) hours as needed for severe pain. (Patient not taking: Reported on 12/15/2016) 15 tablet 0  . ticagrelor (BRILINTA) 90 MG TABS tablet Take 1 tablet (90 mg total) by mouth 2 (two) times daily. (Patient not taking: Reported on 12/15/2016) 60 tablet 1   No current facility-administered medications for this visit.     Review of Systems : See HPI for pertinent positives and negatives.  Physical Examination  Vitals:   12/21/17 1232 12/21/17 1235  BP: 134/85 125/81  Pulse: 84   Resp: 18    Temp: 98.6 F (37 C)   TempSrc: Oral   SpO2: 95%   Weight: 164 lb (74.4 kg)   Height: 5\' 2"  (1.575 m)    Body mass index is 30 kg/m.  General: WDWN obese female in NAD GAIT: normal Eyes: PERRLA HENT: No gross abnormalities.  Pulmonary:  Respirations are non-labored, good air movement, CTAB, no rales, rhonchi, or wheezing. Cardiac: regular rhythm, no detected murmur.  VASCULAR EXAM Carotid Bruits Right Left   Negative Positive     Abdominal aortic pulse is not palpable. Radial pulses are 1+ right, 2+ left  palpable .                                                                                                                   LE Pulses Right Left       FEMORAL  2+ palpable  2+ palpable        POPLITEAL  2+ palpable   2+ palpable       POSTERIOR TIBIAL  2+ palpable   2+ palpable        DORSALIS PEDIS      ANTERIOR TIBIAL 2+ palpable  2+ palpable     Gastrointestinal: soft, nontender, BS WNL, no r/g,  no palpable masses. Musculoskeletal: no muscle atrophy/wasting. M/S 5/5 throughout, extremities without ischemic changes. Skin: No rashes, no ulcers, no cellulitis.   Neurologic:  A&O X 3; appropriate affect, sensation is normal; speech is normal, CN 2-12 intact, pain and light touch intact in extremities, motor exam as listed above. Psychiatric: Normal thought content, mood appropriate to clinical situation.    Assessment: Debra Dennis is a 62 y.o. female who is status post aortobifemoral bypass graft on 04/18/2016 by Dr. Myra Gianotti.  This was done for claudication.    She also has carotid artery stenosis we are monitoring.  She had what sounds like 2 TIA's close together in the 1990's as manifested by slurred speech and loss of lateral half of vision of one eye, does not remember which eye. No subsequent events. She states she was very hypertensive at that time.    Her atherosclerotic risk factors include active smoking x 40 years (currently 1 ppd), DM, obesity, CAD,  dyslipidemia, and controlled hypertension.   I communicated with Dr. Myra Gianotti who feels that with a left ICA EDV of 114, she could wait 6 months for follow up since she is asymptomatic    DATA Carotid Duplex (12/21/17); Right ICA: 40-59% stenosis Left ICA: 60-79%, high end of range, with mid ICA EDV at 114 cm/s. Bilateral ECA: >50% stenosis Bilateral vertebral artery flow is antegrade.  Bilateral subclavian artery waveforms are normal.  Increased stenosis in the left ICA compared to the exam on 12-15-16.    Plan: Follow-up with Dr. Myra Gianotti  in 6 months with carotid duplex.  Follow-up in 2 years for an aortoiliac duplex to check the proximal and distal attachment sites, as advised by Dr. Myra Gianotti in his 12-15-16 note.  Over 3 minutes was spent counseling patient re smoking cessation, and patient was given several free resources re smoking cessation.   I discussed in depth with the patient the nature of atherosclerosis, and emphasized the importance of maximal medical management including strict control of blood pressure, blood glucose, and lipid levels, obtaining regular exercise, and cessation of smoking.  The patient is aware that without maximal medical management the underlying atherosclerotic disease process will progress, limiting the benefit of any interventions. The patient was given information about stroke prevention and what symptoms should prompt the patient to seek immediate medical care. Thank you for allowing [place] to  participate in this patient's care.  Charisse MarchSuzanne Hampton Cost, RN, MSN, FNP-C [facility] [place] Office: [phone]  Clinic Physician: Myra GianottiBrabham  12/21/17 12:54 PM

## 2017-12-21 NOTE — Patient Instructions (Signed)
Steps to Quit Smoking Smoking tobacco can be bad for your health. It can also affect almost every organ in your body. Smoking puts you and people around you at risk for many serious long-lasting (chronic) diseases. Quitting smoking is hard, but it is one of the best things that you can do for your health. It is never too late to quit. What are the benefits of quitting smoking? When you quit smoking, you lower your risk for getting serious diseases and conditions. They can include:  Lung cancer or lung disease.  Heart disease.  Stroke.  Heart attack.  Not being able to have children (infertility).  Weak bones (osteoporosis) and broken bones (fractures).  If you have coughing, wheezing, and shortness of breath, those symptoms may get better when you quit. You may also get sick less often. If you are pregnant, quitting smoking can help to lower your chances of having a baby of low birth weight. What can I do to help me quit smoking? Talk with your doctor about what can help you quit smoking. Some things you can do (strategies) include:  Quitting smoking totally, instead of slowly cutting back how much you smoke over a period of time.  Going to in-person counseling. You are more likely to quit if you go to many counseling sessions.  Using resources and support systems, such as: ? Online chats with a counselor. ? Phone quitlines. ? Printed self-help materials. ? Support groups or group counseling. ? Text messaging programs. ? Mobile phone apps or applications.  Taking medicines. Some of these medicines may have nicotine in them. If you are pregnant or breastfeeding, do not take any medicines to quit smoking unless your doctor says it is okay. Talk with your doctor about counseling or other things that can help you.  Talk with your doctor about using more than one strategy at the same time, such as taking medicines while you are also going to in-person counseling. This can help make  quitting easier. What things can I do to make it easier to quit? Quitting smoking might feel very hard at first, but there is a lot that you can do to make it easier. Take these steps:  Talk to your family and friends. Ask them to support and encourage you.  Call phone quitlines, reach out to support groups, or work with a counselor.  Ask people who smoke to not smoke around you.  Avoid places that make you want (trigger) to smoke, such as: ? Bars. ? Parties. ? Smoke-break areas at work.  Spend time with people who do not smoke.  Lower the stress in your life. Stress can make you want to smoke. Try these things to help your stress: ? Getting regular exercise. ? Deep-breathing exercises. ? Yoga. ? Meditating. ? Doing a body scan. To do this, close your eyes, focus on one area of your body at a time from head to toe, and notice which parts of your body are tense. Try to relax the muscles in those areas.  Download or buy apps on your mobile phone or tablet that can help you stick to your quit plan. There are many free apps, such as QuitGuide from the CDC (Centers for Disease Control and Prevention). You can find more support from smokefree.gov and other websites.  This information is not intended to replace advice given to you by your health care provider. Make sure you discuss any questions you have with your health care provider. Document Released: 04/19/2009 Document   Revised: 02/19/2016 Document Reviewed: 11/07/2014 Elsevier Interactive Patient Education  2018 Elsevier Inc.     Stroke Prevention Some health problems and behaviors may make it more likely for you to have a stroke. Below are ways to lessen your risk of having a stroke.  Be active for at least 30 minutes on most or all days.  Do not smoke. Try not to be around others who smoke.  Do not drink too much alcohol. ? Do not have more than 2 drinks a day if you are a man. ? Do not have more than 1 drink a day if you  are a woman and are not pregnant.  Eat healthy foods, such as fruits and vegetables. If you were put on a specific diet, follow the diet as told.  Keep your cholesterol levels under control through diet and medicines. Look for foods that are low in saturated fat, trans fat, cholesterol, and are high in fiber.  If you have diabetes, follow all diet plans and take your medicine as told.  Ask your doctor if you need treatment to lower your blood pressure. If you have high blood pressure (hypertension), follow all diet plans and take your medicine as told by your doctor.  If you are 18-39 years old, have your blood pressure checked every 3-5 years. If you are age 40 or older, have your blood pressure checked every year.  Keep a healthy weight. Eat foods that are low in calories, salt, saturated fat, trans fat, and cholesterol.  Do not take drugs.  Avoid birth control pills, if this applies. Talk to your doctor about the risks of taking birth control pills.  Talk to your doctor if you have sleep problems (sleep apnea).  Take all medicine as told by your doctor. ? You may be told to take aspirin or blood thinner medicine. Take this medicine as told by your doctor. ? Understand your medicine instructions.  Make sure any other conditions you have are being taken care of.  Get help right away if:  You suddenly lose feeling (you feel numb) or have weakness in your face, arm, or leg.  Your face or eyelid hangs down to one side.  You suddenly feel confused.  You have trouble talking (aphasia) or understanding what people are saying.  You suddenly have trouble seeing in one or both eyes.  You suddenly have trouble walking.  You are dizzy.  You lose your balance or your movements are clumsy (uncoordinated).  You suddenly have a very bad headache and you do not know the cause.  You have new chest pain.  Your heart feels like it is fluttering or skipping a beat (irregular  heartbeat). Do not wait to see if the symptoms above go away. Get help right away. Call your local emergency services (911 in [place] Do not drive yourself to the hospital. This information is not intended to replace advice given to you by your health care provider. Make sure you discuss any questions you have with your health care provider. Document Released: 12/23/2011 Document Revised: 11/29/2015 Document Reviewed: 12/24/2012 Elsevier Interactive Patient Education  2018 Elsevier Inc.  

## 2017-12-23 ENCOUNTER — Telehealth: Payer: Self-pay | Admitting: Surgery

## 2017-12-23 NOTE — Telephone Encounter (Signed)
resch appt 06-14-18 11am Carotid 1145am f/u MD

## 2017-12-25 ENCOUNTER — Other Ambulatory Visit: Payer: Self-pay

## 2017-12-25 DIAGNOSIS — I6523 Occlusion and stenosis of bilateral carotid arteries: Secondary | ICD-10-CM

## 2018-06-14 ENCOUNTER — Inpatient Hospital Stay (HOSPITAL_COMMUNITY): Admission: RE | Admit: 2018-06-14 | Payer: No Typology Code available for payment source | Source: Ambulatory Visit

## 2018-06-14 ENCOUNTER — Ambulatory Visit: Payer: No Typology Code available for payment source | Admitting: Surgery

## 2018-06-15 ENCOUNTER — Encounter: Payer: Self-pay | Admitting: Surgery

## 2018-06-18 ENCOUNTER — Encounter (HOSPITAL_COMMUNITY): Payer: No Typology Code available for payment source

## 2018-06-18 ENCOUNTER — Ambulatory Visit: Payer: No Typology Code available for payment source | Admitting: Family

## 2019-12-12 ENCOUNTER — Encounter (HOSPITAL_COMMUNITY): Payer: BLUE CROSS/BLUE SHIELD

## 2019-12-12 ENCOUNTER — Ambulatory Visit: Payer: No Typology Code available for payment source

## 2019-12-21 ENCOUNTER — Encounter: Payer: Self-pay | Admitting: Surgery

## 2021-12-24 DIAGNOSIS — E782 Mixed hyperlipidemia: Secondary | ICD-10-CM | POA: Diagnosis not present

## 2021-12-24 DIAGNOSIS — E119 Type 2 diabetes mellitus without complications: Secondary | ICD-10-CM | POA: Diagnosis not present

## 2021-12-24 DIAGNOSIS — E559 Vitamin D deficiency, unspecified: Secondary | ICD-10-CM | POA: Diagnosis not present

## 2021-12-24 DIAGNOSIS — I10 Essential (primary) hypertension: Secondary | ICD-10-CM | POA: Diagnosis not present

## 2022-06-24 DIAGNOSIS — J0110 Acute frontal sinusitis, unspecified: Secondary | ICD-10-CM | POA: Diagnosis not present

## 2022-06-24 DIAGNOSIS — G4700 Insomnia, unspecified: Secondary | ICD-10-CM | POA: Diagnosis not present

## 2022-06-24 DIAGNOSIS — K121 Other forms of stomatitis: Secondary | ICD-10-CM | POA: Diagnosis not present

## 2022-06-24 DIAGNOSIS — M5440 Lumbago with sciatica, unspecified side: Secondary | ICD-10-CM | POA: Diagnosis not present

## 2022-06-24 DIAGNOSIS — R69 Illness, unspecified: Secondary | ICD-10-CM | POA: Diagnosis not present

## 2022-06-24 DIAGNOSIS — D649 Anemia, unspecified: Secondary | ICD-10-CM | POA: Diagnosis not present

## 2022-06-24 DIAGNOSIS — K219 Gastro-esophageal reflux disease without esophagitis: Secondary | ICD-10-CM | POA: Diagnosis not present

## 2022-06-24 DIAGNOSIS — E559 Vitamin D deficiency, unspecified: Secondary | ICD-10-CM | POA: Diagnosis not present

## 2022-06-24 DIAGNOSIS — E119 Type 2 diabetes mellitus without complications: Secondary | ICD-10-CM | POA: Diagnosis not present

## 2022-06-24 DIAGNOSIS — I10 Essential (primary) hypertension: Secondary | ICD-10-CM | POA: Diagnosis not present

## 2022-06-24 DIAGNOSIS — E782 Mixed hyperlipidemia: Secondary | ICD-10-CM | POA: Diagnosis not present

## 2022-07-18 DIAGNOSIS — I2510 Atherosclerotic heart disease of native coronary artery without angina pectoris: Secondary | ICD-10-CM | POA: Diagnosis not present

## 2022-07-18 DIAGNOSIS — K1379 Other lesions of oral mucosa: Secondary | ICD-10-CM | POA: Diagnosis not present

## 2022-07-18 DIAGNOSIS — Z7984 Long term (current) use of oral hypoglycemic drugs: Secondary | ICD-10-CM | POA: Diagnosis not present

## 2022-07-18 DIAGNOSIS — E119 Type 2 diabetes mellitus without complications: Secondary | ICD-10-CM | POA: Diagnosis not present

## 2022-07-18 DIAGNOSIS — R6889 Other general symptoms and signs: Secondary | ICD-10-CM | POA: Diagnosis not present

## 2022-07-18 DIAGNOSIS — D649 Anemia, unspecified: Secondary | ICD-10-CM | POA: Diagnosis not present

## 2022-07-18 DIAGNOSIS — I10 Essential (primary) hypertension: Secondary | ICD-10-CM | POA: Diagnosis not present

## 2022-07-18 DIAGNOSIS — R053 Chronic cough: Secondary | ICD-10-CM | POA: Diagnosis not present

## 2022-07-25 ENCOUNTER — Encounter: Payer: Self-pay | Admitting: Family Medicine

## 2022-07-25 DIAGNOSIS — D649 Anemia, unspecified: Secondary | ICD-10-CM | POA: Diagnosis not present

## 2022-07-29 ENCOUNTER — Other Ambulatory Visit: Payer: Self-pay | Admitting: Family Medicine

## 2022-07-29 DIAGNOSIS — F17210 Nicotine dependence, cigarettes, uncomplicated: Secondary | ICD-10-CM

## 2022-08-14 DIAGNOSIS — Z800 Family history of malignant neoplasm of digestive organs: Secondary | ICD-10-CM | POA: Diagnosis not present

## 2022-08-14 DIAGNOSIS — K219 Gastro-esophageal reflux disease without esophagitis: Secondary | ICD-10-CM | POA: Diagnosis not present

## 2022-08-14 DIAGNOSIS — D500 Iron deficiency anemia secondary to blood loss (chronic): Secondary | ICD-10-CM | POA: Diagnosis not present

## 2022-08-14 DIAGNOSIS — R195 Other fecal abnormalities: Secondary | ICD-10-CM | POA: Diagnosis not present

## 2022-08-29 ENCOUNTER — Ambulatory Visit
Admission: RE | Admit: 2022-08-29 | Discharge: 2022-08-29 | Disposition: A | Discharge status: Home / Self Care | Payer: Medicare HMO | Source: Ambulatory Visit | Attending: Family Medicine | Admitting: Family Medicine

## 2022-08-29 DIAGNOSIS — F17210 Nicotine dependence, cigarettes, uncomplicated: Secondary | ICD-10-CM

## 2022-09-03 DIAGNOSIS — K2289 Other specified disease of esophagus: Secondary | ICD-10-CM | POA: Diagnosis not present

## 2022-09-03 DIAGNOSIS — Z800 Family history of malignant neoplasm of digestive organs: Secondary | ICD-10-CM | POA: Diagnosis not present

## 2022-09-03 DIAGNOSIS — K2980 Duodenitis without bleeding: Secondary | ICD-10-CM | POA: Diagnosis not present

## 2022-09-03 DIAGNOSIS — R195 Other fecal abnormalities: Secondary | ICD-10-CM | POA: Diagnosis not present

## 2022-09-03 DIAGNOSIS — K319 Disease of stomach and duodenum, unspecified: Secondary | ICD-10-CM | POA: Diagnosis not present

## 2022-09-03 DIAGNOSIS — D509 Iron deficiency anemia, unspecified: Secondary | ICD-10-CM | POA: Diagnosis not present

## 2022-09-03 DIAGNOSIS — K648 Other hemorrhoids: Secondary | ICD-10-CM | POA: Diagnosis not present

## 2022-09-03 DIAGNOSIS — K3189 Other diseases of stomach and duodenum: Secondary | ICD-10-CM | POA: Diagnosis not present

## 2022-09-03 DIAGNOSIS — K259 Gastric ulcer, unspecified as acute or chronic, without hemorrhage or perforation: Secondary | ICD-10-CM | POA: Diagnosis not present

## 2022-09-03 DIAGNOSIS — K6389 Other specified diseases of intestine: Secondary | ICD-10-CM | POA: Diagnosis not present

## 2022-09-03 DIAGNOSIS — D125 Benign neoplasm of sigmoid colon: Secondary | ICD-10-CM | POA: Diagnosis not present

## 2022-09-03 DIAGNOSIS — K219 Gastro-esophageal reflux disease without esophagitis: Secondary | ICD-10-CM | POA: Diagnosis not present

## 2022-09-04 ENCOUNTER — Other Ambulatory Visit: Payer: Self-pay | Admitting: Family Medicine

## 2022-09-04 DIAGNOSIS — K8020 Calculus of gallbladder without cholecystitis without obstruction: Secondary | ICD-10-CM

## 2022-09-05 DIAGNOSIS — K319 Disease of stomach and duodenum, unspecified: Secondary | ICD-10-CM | POA: Diagnosis not present

## 2022-09-05 DIAGNOSIS — K219 Gastro-esophageal reflux disease without esophagitis: Secondary | ICD-10-CM | POA: Diagnosis not present

## 2022-09-05 DIAGNOSIS — K2980 Duodenitis without bleeding: Secondary | ICD-10-CM | POA: Diagnosis not present

## 2022-09-08 ENCOUNTER — Ambulatory Visit: Payer: Medicare HMO | Admitting: Internal Medicine

## 2022-09-08 ENCOUNTER — Encounter: Payer: Self-pay | Admitting: Internal Medicine

## 2022-09-08 VITALS — BP 115/64 | HR 89 | Resp 16 | Ht 62.0 in | Wt 138.8 lb

## 2022-09-08 DIAGNOSIS — I10 Essential (primary) hypertension: Secondary | ICD-10-CM

## 2022-09-08 DIAGNOSIS — I714 Abdominal aortic aneurysm, without rupture, unspecified: Secondary | ICD-10-CM | POA: Diagnosis not present

## 2022-09-08 DIAGNOSIS — R9431 Abnormal electrocardiogram [ECG] [EKG]: Secondary | ICD-10-CM | POA: Diagnosis not present

## 2022-09-08 DIAGNOSIS — I2510 Atherosclerotic heart disease of native coronary artery without angina pectoris: Secondary | ICD-10-CM | POA: Diagnosis not present

## 2022-09-08 DIAGNOSIS — E782 Mixed hyperlipidemia: Secondary | ICD-10-CM

## 2022-09-08 DIAGNOSIS — I6523 Occlusion and stenosis of bilateral carotid arteries: Secondary | ICD-10-CM | POA: Diagnosis not present

## 2022-09-08 DIAGNOSIS — I252 Old myocardial infarction: Secondary | ICD-10-CM | POA: Diagnosis not present

## 2022-09-08 NOTE — Progress Notes (Signed)
Primary Physician/Referring:  Scotty Court, DO  Patient ID: Debra Dennis, female    DOB: [birth year], 67 y.o.   MRN: [id number]  Chief Complaint  Patient presents with   Atherosclerosis   New Patient (Initial Visit)    Referred by Dr. Aura Dials   HPI:    Debra Dennis  is a 67 y.o. female who is a vasculopath given her history of CAD, carotid stenosis, and AAA status postrepair who is here to establish care with cardiology.  Patient had surgery on her AAA 5 or 6 years ago and she has not followed up with cardiology since.  She says she has been feeling fine so she did not bother following up.  Patient has been very short of breath especially with exertion lately.  Of note she was noted to be severely anemic at her primary care physician's office.  This could be causing all her symptoms.  She did have EGD and colonoscopy last week but we are still waiting for the results.  Patient had a cardiac stent placed probably about 10 years ago and she thinks she had some intervention on her carotid arteries but she does not remember when or where.  She is still a cigarette smoker and she says she has tried everything to quit but is unable to.  Patient denies chest pain, palpitations, diaphoresis, syncope, edema, orthopnea, PND.  Past Medical History:  Diagnosis Date   Bilateral carotid artery disease (HCC)    CAD (coronary artery disease)    Claudication (HCC)    Diabetes mellitus ([place])    GERD (gastroesophageal reflux disease)    Hyperlipidemia    Hypertension    Myocardial infarction ([place])    Restless leg    Shingles 2011   Stress shingles    TIA (transient ischemic attack)    X 2   Past Surgical History:  Procedure Laterality Date   AORTA - BILATERAL FEMORAL ARTERY BYPASS GRAFT Bilateral 04/18/2016   Procedure: AORTOBIFEMORAL BYPASS GRAFT;  Surgeon: Serafina Mitchell, MD;  Location: [facility];  Service: Vascular;  Laterality: Bilateral;   APPENDECTOMY     CARDIAC CATHETERIZATION      COLONOSCOPY     CRANIOTOMY     CYST EXCISION PERINEAL     HEEL SPUR RESECTION     PERIPHERAL VASCULAR CATHETERIZATION Bilateral 11/05/2015   Procedure: Lower Extremity Angiography;  Surgeon: Lorretta Harp, MD;  Location: [facility];  Service: Cardiovascular;  Laterality: Bilateral;   PERIPHERAL VASCULAR CATHETERIZATION N/A 11/05/2015   Procedure: Abdominal Aortogram;  Surgeon: Lorretta Harp, MD;  Location: [facility];  Service: Cardiovascular;  Laterality: N/A;   TUBAL LIGATION     Family History  Problem Relation Age of Onset   Sjogren's syndrome Mother    Heart attack Father 26       First one   CAD Father    Diabetes Sister    Celiac disease Sister    Celiac disease Sister    Diabetes Brother    Cancer Maternal Grandmother    Heart attack Paternal Grandmother    CAD Son     Social History   Tobacco Use   Smoking status: Some Days    Packs/day: 0.50    Years: 40.00    Total pack years: 20.00    Types: Cigarettes   Smokeless tobacco: Former  Substance Use Topics   Alcohol use: No    Alcohol/week: 0.0 standard drinks of alcohol   Marital Status:  Widowed  ROS  Review of Systems  Cardiovascular:  Positive for dyspnea on exertion.  Respiratory:  Positive for shortness of breath.    Objective  Blood pressure 115/64, pulse 89, resp. rate 16, height '5\' 2"'$  (1.575 m), weight 138 lb 12.8 oz (63 kg), SpO2 96 %. Body mass index is 25.39 kg/m.     09/08/2022    1:13 PM 12/21/2017   12:35 PM 12/21/2017   12:32 PM  Vitals with BMI  Height '5\' 2"'$   '5\' 2"'$   Weight 138 lbs 13 oz  164 lbs  BMI [id number]  [id number]  Systolic [id number]  Diastolic 64 81 85  Pulse 89  84     Physical Exam Vitals reviewed.  HENT:     Head: Normocephalic and atraumatic.  Neck:     Vascular: Carotid bruit present.  Cardiovascular:     Rate and Rhythm: Normal rate and regular rhythm.     Heart sounds: Murmur heard.  Pulmonary:     Effort: Pulmonary effort is normal.     Breath sounds:  Wheezing present.  Abdominal:     General: Bowel sounds are normal.  Musculoskeletal:     Right lower leg: No edema.     Left lower leg: No edema.  Skin:    General: Skin is warm and dry.  Neurological:     Mental Status: She is alert.     Medications and allergies   Allergies  Allergen Reactions   Carbamazepine Other (See Comments)    ABNORMAL LABS ELEVATION OF LIVER ENZYMES    Dilantin [Phenytoin] Hives   Metoprolol Other (See Comments)    "Cause my heart to pause"     Medication list after today's encounter   Current Outpatient Medications:    amLODipine (NORVASC) 5 MG tablet, Take 5 mg by mouth daily., Disp: , Rfl:    artificial tears (LACRILUBE) OINT ophthalmic ointment, Place 1 application into the left eye at bedtime as needed (for dry/red/irritated eyes.)., Disp: , Rfl:    aspirin EC 81 MG tablet, Take 81 mg by mouth daily., Disp: , Rfl:    cyclobenzaprine (FLEXERIL) 10 MG tablet, Take 10 mg by mouth at bedtime., Disp: , Rfl:    Docusate Sodium (COLACE PO), Take 100 mg by mouth 2 (two) times daily. , Disp: , Rfl:    FARXIGA 10 MG TABS tablet, Take 10 mg by mouth daily., Disp: , Rfl:    Loratadine (CLARITIN) 10 MG CAPS, Take 10 mg by mouth daily., Disp: , Rfl:    metFORMIN (GLUCOPHAGE) 500 MG tablet, Take 500 mg by mouth 2 (two) times daily., Disp: , Rfl:    Multiple Vitamin (MULTIVITAMIN WITH MINERALS) TABS tablet, Take 1 tablet by mouth daily., Disp: , Rfl:    nitroGLYCERIN (NITROSTAT) 0.4 MG SL tablet, Place 1 tablet under the tongue every 5 (five) minutes as needed for chest pain. , Disp: , Rfl:    Omega-3 Fatty Acids (FISH OIL) 1000 MG CAPS, Take 1 capsule by mouth 2 (two) times daily., Disp: , Rfl:    omeprazole (PRILOSEC) 20 MG capsule, Take 20 mg by mouth 2 (two) times daily., Disp: , Rfl:    PARoxetine (PAXIL) 10 MG tablet, Take 10 mg by mouth daily., Disp: , Rfl:    rOPINIRole (REQUIP) 0.25 MG tablet, Take 0.25 mg by mouth 3 (three) times daily as needed.  For leg cramps/restless legs., Disp: , Rfl:    rosuvastatin (CRESTOR) 40 MG tablet, Take 1 tablet (40  mg total) by mouth daily., Disp: 30 tablet, Rfl: 6   Vitamin D, Ergocalciferol, (DRISDOL) 1.25 MG (50000 UNIT) CAPS capsule, Take 50,000 Units by mouth every 7 (seven) days., Disp: , Rfl:   Laboratory examination:   Lab Results  Component Value Date   NA 139 04/23/2016   K 3.3 (L) 04/23/2016   CO2 28 04/23/2016   GLUCOSE 128 (H) 04/23/2016   BUN 6 04/23/2016   CREATININE 0.59 04/23/2016   CALCIUM 8.9 04/23/2016   GFRNONAA >60 04/23/2016       Latest Ref Rng & Units 04/23/2016    2:52 AM 04/22/2016    2:18 AM 04/21/2016    3:09 AM  CMP  Glucose 65 - 99 mg/dL 128  133  159   BUN 6 - 20 mg/dL '6  5  5   '$ Creatinine 0.44 - 1.00 mg/dL 0.59  0.59  0.59   Sodium 135 - 145 mmol/L 139  137  138   Potassium 3.5 - 5.1 mmol/L 3.3  3.6  4.0   Chloride 101 - 111 mmol/L 102  104  105   CO2 22 - 32 mmol/L '28  25  28   '$ Calcium 8.9 - 10.3 mg/dL 8.9  8.5  8.3       Latest Ref Rng & Units 04/23/2016    2:52 AM 04/22/2016    2:18 AM 04/21/2016    3:09 AM  CBC  WBC 4.0 - 10.5 K/uL 7.3  7.7  9.3   Hemoglobin 12.0 - 15.0 g/dL 8.4  7.6  7.6   Hematocrit 36.0 - 46.0 % 27.5  25.1  25.6   Platelets 150 - 400 K/uL 300  233  197     Lipid Panel No results for input(s): "CHOL", "TRIG", "LDLCALC", "VLDL", "HDL", "CHOLHDL", "LDLDIRECT" in the last 8760 hours.  HEMOGLOBIN A1C Lab Results  Component Value Date   HGBA1C 7.0 (H) 04/11/2016   MPG 154 04/11/2016   TSH No results for input(s): "TSH" in the last 8760 hours.  External labs:   Labs 07/18/2022: TIBC 487 Iron 26 Iron sat 5% Transferrin 348 ferritin 6.9 HGB 8.8 HCT 29.5 PLT 356 FOBT positive  Radiology:   08/29/2022 LDCT IMPRESSION: 1. Lung-RADS 2S, benign appearance or behavior. Continue annual screening with low-dose chest CT without contrast in 12 months. 2. The "S" modifier above refers to potentially clinically significant non  lung cancer related findings. Specifically, there is aortic atherosclerosis, in addition to left main and three-vessel coronary artery disease. Please note that although the presence of coronary artery calcium documents the presence of coronary artery disease, the severity of this disease and any potential stenosis cannot be assessed on this non-gated CT examination. Assessment for potential risk factor modification, dietary therapy or pharmacologic therapy may be warranted, if clinically indicated. 3. There are also findings in the lungs concerning for interstitial lung disease, with a spectrum of findings considered most likely to reflect an alternative diagnosis (not usual interstitial pneumonia) at this time. Outpatient referral to Pulmonology for further clinical evaluation is recommended. 4. Mild diffuse bronchial wall thickening with very mild centrilobular and paraseptal emphysema; imaging findings suggestive of underlying COPD.   Cardiac Studies:     EKG:   09/08/2022: Sinus Rhythm, rate 76 bpm, with LAE.  Old anteroseptal infarct.  Assessment     ICD-10-CM   1. Coronary artery calcification seen on CT scan  I25.10 PCV ECHOCARDIOGRAM COMPLETE    PCV CAROTID DUPLEX (BILATERAL)    PCV AORTA  DUPLEX    PCV MYOCARDIAL PERFUSION WO LEXISCAN    2. MI, old  I25.2 PCV ECHOCARDIOGRAM COMPLETE    PCV CAROTID DUPLEX (BILATERAL)    PCV AORTA DUPLEX    PCV MYOCARDIAL PERFUSION WO LEXISCAN    3. Coronary artery disease involving native coronary artery of native heart without angina pectoris  I25.10 EKG 12-Lead    PCV ECHOCARDIOGRAM COMPLETE    PCV CAROTID DUPLEX (BILATERAL)    PCV AORTA DUPLEX    PCV MYOCARDIAL PERFUSION WO LEXISCAN    4. Nonspecific abnormal electrocardiogram (ECG) (EKG)  R94.31 PCV ECHOCARDIOGRAM COMPLETE    PCV CAROTID DUPLEX (BILATERAL)    PCV AORTA DUPLEX    PCV MYOCARDIAL PERFUSION WO LEXISCAN    5. Bilateral carotid artery stenosis  I65.23 PCV ECHOCARDIOGRAM  COMPLETE    PCV CAROTID DUPLEX (BILATERAL)    PCV AORTA DUPLEX    PCV MYOCARDIAL PERFUSION WO LEXISCAN    6. Essential hypertension  I10 PCV ECHOCARDIOGRAM COMPLETE    PCV CAROTID DUPLEX (BILATERAL)    PCV AORTA DUPLEX    PCV MYOCARDIAL PERFUSION WO LEXISCAN    7. Mixed hyperlipidemia  E78.2 PCV ECHOCARDIOGRAM COMPLETE    PCV CAROTID DUPLEX (BILATERAL)    PCV AORTA DUPLEX    PCV MYOCARDIAL PERFUSION WO LEXISCAN    8. Abdominal aortic aneurysm (AAA) without rupture, unspecified part ([place])  I71.40 PCV ECHOCARDIOGRAM COMPLETE    PCV CAROTID DUPLEX (BILATERAL)    PCV AORTA DUPLEX    PCV MYOCARDIAL PERFUSION WO LEXISCAN       Orders Placed This Encounter  Procedures   PCV MYOCARDIAL PERFUSION WO LEXISCAN    Standing Status:   Future    Standing Expiration Date:   11/08/2022   EKG 12-Lead   PCV ECHOCARDIOGRAM COMPLETE    Standing Status:   Future    Standing Expiration Date:   09/08/2023    No orders of the defined types were placed in this encounter.   Medications Discontinued During This Encounter  Medication Reason   cephALEXin (KEFLEX) 500 MG capsule    Armodafinil 250 MG tablet    buPROPion (WELLBUTRIN SR) 150 MG 12 hr tablet Patient Preference   Calcium Carbonate-Vitamin D (CALCIUM-VITAMIN D) 500-200 MG-UNIT tablet    hydroxypropyl methylcellulose / hypromellose (ISOPTO TEARS / GONIOVISC) 2.5 % ophthalmic solution    oxyCODONE-acetaminophen (PERCOCET/ROXICET) 5-325 MG tablet    ticagrelor (BRILINTA) 90 MG TABS tablet      Recommendations:   Debra Dennis is a 67 y.o.  female who is a vasculopath s/p many interventions    Coronary artery disease involving native coronary artery of native heart without angina pectoris Nonspecific abnormal electrocardiogram (ECG) (EKG) Coronary artery calcification seen on CT scan MI, old Echocardiogram and stress test ordered Patient had stent placed 8+ years ago She completed ASA and Brillinta course, now just on  aspirin   Bilateral carotid artery stenosis Carotid duplex ordered   Essential hypertension Continue current cardiac medications. Encourage low-sodium diet, less than 2000 mg daily. BP very well controlled on current regiment   Mixed hyperlipidemia Continue Crestor 40 mg daily   Abdominal aortic aneurysm (AAA) without rupture, s/p surgery about 5-6 years ago Will obtain ultrasound of the abdominal aorta. Patient had symptoms of claudication before this was repaired. No episodes of abd pain, leg pain, or claudication since AAA was repaired She has not seen cardiology since AAA repair and she has not had repeat cardiac testing in many years Follow-up in 2 months  or sooner if needed     Floydene Flock, DO, [facility]  09/08/2022, 1:54 PM Office: [phone] Pager: [phone]

## 2022-09-23 ENCOUNTER — Ambulatory Visit: Payer: Medicare HMO | Admitting: Family Medicine

## 2022-09-25 ENCOUNTER — Ambulatory Visit: Payer: Medicare HMO | Admitting: Pulmonary Disease

## 2022-09-25 ENCOUNTER — Encounter: Payer: Self-pay | Admitting: Pulmonary Disease

## 2022-09-25 VITALS — BP 120/62 | HR 86 | Ht 62.0 in | Wt 132.0 lb

## 2022-09-25 DIAGNOSIS — J849 Interstitial pulmonary disease, unspecified: Secondary | ICD-10-CM | POA: Diagnosis not present

## 2022-09-25 DIAGNOSIS — F17210 Nicotine dependence, cigarettes, uncomplicated: Secondary | ICD-10-CM

## 2022-09-25 DIAGNOSIS — R69 Illness, unspecified: Secondary | ICD-10-CM | POA: Diagnosis not present

## 2022-09-25 NOTE — Patient Instructions (Signed)
We will check inflammatory lab work to rule out autoimmune disease that could be causing your lung findings.  Recommend quitting smoking by using nicotine replacement therapy - use 14mg  nicotine patch per day - using mini nicotine lozenges as needed - set a quit date 2 weeks from starting the patches  Follow up in 2 months with pulmonary function tests

## 2022-09-25 NOTE — Progress Notes (Signed)
Synopsis: Referred in March 2024 for abnormal CT Chest scan by Aura Dials, MD  Subjective:   PATIENT ID: Debra Dennis GENDER: female DOB: 08-05-1955, MRN: [id number]  HPI  Chief Complaint  Patient presents with   Consult    Referred by PCP for abnormal CT that was done back in February 2024.   Debra Dennis is a 67 year old woman, daily smoker with DMII, GERD, CAD and TIA who is referred to pulmonary clinic for abnormal chest imaging.   She had LCS CT Chest 08/29/22 which showed LLL pulmonary nodule measuring 5.94mm. Mild diffuse bronchial wall thickening with very mild centrilobular and paraseptal emphysema. Patchy areas of scattered ground glass attenuation, septal thickening and subpleural reticulation are randomly distributed throughout the lungs bilaterally with no craniocaudal gradient.   She has been undergoing evaluation for anemia. She had colonoscopy and is now going for capsule endoscopy through [facility].   Her left eye is dry from trigeminal neuralgia procedure in the past. She has had dry mouth for many years.   She has a joint pain in first digit.   She is smoking 0.5 to 1 pack per day for 45 years. She is a retired a Marine scientist.   Mother had sjogren's disease   Past Medical History:  Diagnosis Date   Bilateral carotid artery disease (HCC)    CAD (coronary artery disease)    Claudication (HCC)    Diabetes mellitus ([place])    GERD (gastroesophageal reflux disease)    Hyperlipidemia    Hypertension    Myocardial infarction ([place])    Restless leg    Shingles 2011   Stress shingles    TIA (transient ischemic attack)    X 2     Family History  Problem Relation Age of Onset   Sjogren's syndrome Mother    Heart attack Father 68       First one   CAD Father    Diabetes Sister    Celiac disease Sister    Celiac disease Sister    Diabetes Brother    Cancer Maternal Grandmother    Heart attack Paternal Grandmother    CAD Son      Social History    Socioeconomic History   Marital status: Widowed    Spouse name: Not on file   Number of children: 2   Years of education: Not on file   Highest education level: Not on file  Occupational History   Not on file  Tobacco Use   Smoking status: Some Days    Packs/day: 0.50    Years: 40.00    Additional pack years: 0.00    Total pack years: 20.00    Types: Cigarettes   Smokeless tobacco: Former  Scientific laboratory technician Use: Never used  Substance and Sexual Activity   Alcohol use: No    Alcohol/week: 0.0 standard drinks of alcohol   Drug use: No   Sexual activity: Not on file  Other Topics Concern   Not on file  Social History Narrative   Not on file   Social Determinants of Health   Financial Resource Strain: Not on file  Food Insecurity: Not on file  Transportation Needs: Not on file  Physical Activity: Not on file  Stress: Not on file  Social Connections: Not on file  Intimate Partner Violence: Not on file     Allergies  Allergen Reactions   Carbamazepine Other (See Comments)    ABNORMAL LABS ELEVATION OF LIVER ENZYMES  Dilantin [Phenytoin] Hives   Metoprolol Other (See Comments)    "Cause my heart to pause"     Outpatient Medications Prior to Visit  Medication Sig Dispense Refill   artificial tears (LACRILUBE) OINT ophthalmic ointment Place 1 application into the left eye at bedtime as needed (for dry/red/irritated eyes.).     aspirin EC 81 MG tablet Take 81 mg by mouth daily.     cyclobenzaprine (FLEXERIL) 10 MG tablet Take 10 mg by mouth at bedtime.     Docusate Sodium (COLACE PO) Take 100 mg by mouth 2 (two) times daily.      FARXIGA 10 MG TABS tablet Take 10 mg by mouth daily.     Loratadine (CLARITIN) 10 MG CAPS Take 10 mg by mouth daily.     metFORMIN (GLUCOPHAGE) 500 MG tablet Take 500 mg by mouth 2 (two) times daily.     Multiple Vitamin (MULTIVITAMIN WITH MINERALS) TABS tablet Take 1 tablet by mouth daily.     Omega-3 Fatty Acids (FISH OIL) 1000 MG  CAPS Take 1 capsule by mouth 2 (two) times daily.     omeprazole (PRILOSEC) 20 MG capsule Take 20 mg by mouth 2 (two) times daily.     PARoxetine (PAXIL) 10 MG tablet Take 10 mg by mouth daily.     rOPINIRole (REQUIP) 0.25 MG tablet Take 0.25 mg by mouth 3 (three) times daily as needed. For leg cramps/restless legs.     rosuvastatin (CRESTOR) 40 MG tablet Take 1 tablet (40 mg total) by mouth daily. 30 tablet 6   Vitamin D, Ergocalciferol, (DRISDOL) 1.25 MG (50000 UNIT) CAPS capsule Take 50,000 Units by mouth every 7 (seven) days.     amLODipine (NORVASC) 5 MG tablet Take 5 mg by mouth daily.     nitroGLYCERIN (NITROSTAT) 0.4 MG SL tablet Place 1 tablet under the tongue every 5 (five) minutes as needed for chest pain.      No facility-administered medications prior to visit.    Review of Systems  Constitutional:  Negative for chills, fever, malaise/fatigue and weight loss.  HENT:  Negative for congestion, sinus pain and sore throat.   Eyes: Negative.   Respiratory:  Positive for shortness of breath. Negative for cough, hemoptysis, sputum production and wheezing.   Cardiovascular:  Negative for chest pain, palpitations, orthopnea, claudication and leg swelling.  Gastrointestinal:  Positive for heartburn. Negative for abdominal pain, nausea and vomiting.  Genitourinary: Negative.   Musculoskeletal:  Positive for joint pain. Negative for myalgias.  Skin:  Negative for rash.  Neurological:  Negative for weakness.  Endo/Heme/Allergies: Negative.   Psychiatric/Behavioral: Negative.     Objective:   Vitals:   09/25/22 1502  BP: 120/62  Pulse: 86  SpO2: 95%  Weight: 132 lb (59.9 kg)  Height: 5\' 2"  (1.575 m)   Physical Exam Constitutional:      General: She is not in acute distress.    Appearance: She is not ill-appearing.  HENT:     Head: Normocephalic and atraumatic.  Eyes:     General: No scleral icterus.    Conjunctiva/sclera: Conjunctivae normal.     Pupils: Pupils are equal,  round, and reactive to light.  Cardiovascular:     Rate and Rhythm: Normal rate and regular rhythm.     Pulses: Normal pulses.     Heart sounds: Normal heart sounds. No murmur heard. Pulmonary:     Effort: Pulmonary effort is normal.     Breath sounds: Normal breath sounds. No wheezing,  rhonchi or rales.  Abdominal:     General: Bowel sounds are normal.     Palpations: Abdomen is soft.  Musculoskeletal:     Right lower leg: No edema.     Left lower leg: No edema.  Lymphadenopathy:     Cervical: No cervical adenopathy.  Skin:    General: Skin is warm and dry.  Neurological:     General: No focal deficit present.     Mental Status: She is alert.  Psychiatric:        Mood and Affect: Mood normal.        Behavior: Behavior normal.        Thought Content: Thought content normal.        Judgment: Judgment normal.    CBC    Component Value Date/Time   WBC 7.3 04/23/2016 0252   RBC 3.49 (L) 04/23/2016 0252   HGB 8.4 (L) 04/23/2016 0252   HCT 27.5 (L) 04/23/2016 0252   PLT 300 04/23/2016 0252   MCV 78.8 04/23/2016 0252   MCH 24.1 (L) 04/23/2016 0252   MCHC 30.5 04/23/2016 0252   RDW 16.3 (H) 04/23/2016 0252   LYMPHSABS 2,225 10/30/2015 1049   MONOABS 801 10/30/2015 1049   EOSABS 178 10/30/2015 1049   BASOSABS 0 10/30/2015 1049      Latest Ref Rng & Units 04/23/2016    2:52 AM 04/22/2016    2:18 AM 04/21/2016    3:09 AM  BMP  Glucose 65 - 99 mg/dL 128  133  159   BUN 6 - 20 mg/dL 6  5  5    Creatinine 0.44 - 1.00 mg/dL 0.59  0.59  0.59   Sodium 135 - 145 mmol/L 139  137  138   Potassium 3.5 - 5.1 mmol/L 3.3  3.6  4.0   Chloride 101 - 111 mmol/L 102  104  105   CO2 22 - 32 mmol/L 28  25  28    Calcium 8.9 - 10.3 mg/dL 8.9  8.5  8.3    Chest imaging: CT Chest 08/29/22 1. Lung-RADS 2S, benign appearance or behavior. Continue annual screening with low-dose chest CT without contrast in 12 months. 2. The "S" modifier above refers to potentially clinically significant non  lung cancer related findings. Specifically, there is aortic atherosclerosis, in addition to left main and three-vessel coronary artery disease. Please note that although the presence of coronary artery calcium documents the presence of coronary artery disease, the severity of this disease and any potential stenosis cannot be assessed on this non-gated CT examination. Assessment for potential Dennis factor modification, dietary therapy or pharmacologic therapy may be warranted, if clinically indicated. 3. There are also findings in the lungs concerning for interstitial lung disease, with a spectrum of findings considered most likely to reflect an alternative diagnosis (not usual interstitial pneumonia) at this time. Outpatient referral to Pulmonology for further clinical evaluation is recommended. 4. Mild diffuse bronchial wall thickening with very mild centrilobular and paraseptal emphysema; imaging findings suggestive of underlying COPD.  PFT:     No data to display          Labs:  Path:  Echo:  Heart Catheterization:  Assessment & Plan:   ILD (interstitial lung disease) ([place]) - Plan: Pulmonary Function Test, ANA, ANCA screen with reflex titer, Anti-DNA antibody, double-stranded, Anti-scleroderma antibody, Anti-Smith antibody, Centromere Antibodies, Cyclic citrul peptide antibody, IgG, Hypersensitivity Pneumonitis, Rheumatoid factor, RNP Antibodies, Sjogren's syndrome antibods(ssa + ssb)  Cigarette smoker  Discussion: Debra Dennis is  a 67 year old woman, daily smoker with DMII, GERD, CAD and TIA who is referred to pulmonary clinic for abnormal chest imaging.   She has CT Chest scan findings concerning for NSIP. She has family history of Sjogren's syndrome. She also has significant smoking history.   Fortunately she is not very symptomatic at this time. She does not wish to try inhaler therapy.  We will check inflammatory lab workup for autoimmune lung  disease.  Recommend smoking cessation with nicotine replacement therapy with 14mg  nicotine patches and as needed mini nicotine lozenges.   Follow up in 2 months with pulmonary function tests.   Freda Jackson, MD [facility] Office: [phone]   Current Outpatient Medications:    artificial tears (LACRILUBE) OINT ophthalmic ointment, Place 1 application into the left eye at bedtime as needed (for dry/red/irritated eyes.)., Disp: , Rfl:    aspirin EC 81 MG tablet, Take 81 mg by mouth daily., Disp: , Rfl:    cyclobenzaprine (FLEXERIL) 10 MG tablet, Take 10 mg by mouth at bedtime., Disp: , Rfl:    Docusate Sodium (COLACE PO), Take 100 mg by mouth 2 (two) times daily. , Disp: , Rfl:    FARXIGA 10 MG TABS tablet, Take 10 mg by mouth daily., Disp: , Rfl:    Loratadine (CLARITIN) 10 MG CAPS, Take 10 mg by mouth daily., Disp: , Rfl:    metFORMIN (GLUCOPHAGE) 500 MG tablet, Take 500 mg by mouth 2 (two) times daily., Disp: , Rfl:    Multiple Vitamin (MULTIVITAMIN WITH MINERALS) TABS tablet, Take 1 tablet by mouth daily., Disp: , Rfl:    Omega-3 Fatty Acids (FISH OIL) 1000 MG CAPS, Take 1 capsule by mouth 2 (two) times daily., Disp: , Rfl:    omeprazole (PRILOSEC) 20 MG capsule, Take 20 mg by mouth 2 (two) times daily., Disp: , Rfl:    PARoxetine (PAXIL) 10 MG tablet, Take 10 mg by mouth daily., Disp: , Rfl:    rOPINIRole (REQUIP) 0.25 MG tablet, Take 0.25 mg by mouth 3 (three) times daily as needed. For leg cramps/restless legs., Disp: , Rfl:    rosuvastatin (CRESTOR) 40 MG tablet, Take 1 tablet (40 mg total) by mouth daily., Disp: 30 tablet, Rfl: 6   Vitamin D, Ergocalciferol, (DRISDOL) 1.25 MG (50000 UNIT) CAPS capsule, Take 50,000 Units by mouth every 7 (seven) days., Disp: , Rfl:    amLODipine (NORVASC) 5 MG tablet, Take 5 mg by mouth daily., Disp: , Rfl:    nitroGLYCERIN (NITROSTAT) 0.4 MG SL tablet, Place 1 tablet under the tongue every 5 (five) minutes as needed for  chest pain. , Disp: , Rfl:

## 2022-09-26 ENCOUNTER — Other Ambulatory Visit: Payer: No Typology Code available for payment source

## 2022-09-26 DIAGNOSIS — D500 Iron deficiency anemia secondary to blood loss (chronic): Secondary | ICD-10-CM | POA: Diagnosis not present

## 2022-09-26 DIAGNOSIS — J849 Interstitial pulmonary disease, unspecified: Secondary | ICD-10-CM | POA: Diagnosis not present

## 2022-09-30 LAB — ANA: Anti Nuclear Antibody (ANA): POSITIVE — AB

## 2022-09-30 LAB — RFLX ATYPICAL P-ANCA TITER: ATYPICAL P ANCA TITER: 1:40 {titer} — ABNORMAL HIGH

## 2022-09-30 LAB — ANTI-SCLERODERMA ANTIBODY: Scleroderma (Scl-70) (ENA) Antibody, IgG: <1 AI

## 2022-09-30 LAB — ANTI-DNA ANTIBODY, DOUBLE-STRANDED: ds DNA Ab: <1 IU/mL

## 2022-09-30 LAB — RHEUMATOID FACTOR: Rheumatoid fact SerPl-aCnc: <14 IU/mL (ref <14)

## 2022-09-30 LAB — ANTI-NUCLEAR AB-TITER (ANA TITER): ANA Titer 1: 1:80 {titer} — ABNORMAL HIGH

## 2022-09-30 LAB — CYCLIC CITRUL PEPTIDE ANTIBODY, IGG: Cyclic Citrullin Peptide Ab: <16 UNITS

## 2022-09-30 LAB — SJOGREN'S SYNDROME ANTIBODS(SSA + SSB)
SSA (Ro) (ENA) Antibody, IgG: <1 AI
SSB (La) (ENA) Antibody, IgG: <1 AI

## 2022-09-30 LAB — ANTI-SMITH ANTIBODY: ENA SM Ab Ser-aCnc: <1 AI

## 2022-09-30 LAB — ANCA SCREEN W REFLEX TITER: ANCA SCREEN: POSITIVE — AB

## 2022-09-30 LAB — CENTROMERE ANTIBODIES: Centromere Ab Screen: <1 AI

## 2022-10-02 ENCOUNTER — Ambulatory Visit: Payer: Medicare HMO

## 2022-10-02 DIAGNOSIS — I2510 Atherosclerotic heart disease of native coronary artery without angina pectoris: Secondary | ICD-10-CM

## 2022-10-02 DIAGNOSIS — I6523 Occlusion and stenosis of bilateral carotid arteries: Secondary | ICD-10-CM

## 2022-10-02 DIAGNOSIS — I10 Essential (primary) hypertension: Secondary | ICD-10-CM

## 2022-10-02 DIAGNOSIS — R9431 Abnormal electrocardiogram [ECG] [EKG]: Secondary | ICD-10-CM

## 2022-10-02 DIAGNOSIS — E782 Mixed hyperlipidemia: Secondary | ICD-10-CM

## 2022-10-02 DIAGNOSIS — I252 Old myocardial infarction: Secondary | ICD-10-CM

## 2022-10-02 DIAGNOSIS — I714 Abdominal aortic aneurysm, without rupture, unspecified: Secondary | ICD-10-CM

## 2022-10-04 LAB — HYPERSENSITIVITY PNEUMONITIS
A. Pullulans Abs: NEGATIVE
A.Fumigatus #1 Abs: NEGATIVE
Micropolyspora faeni, IgG: NEGATIVE
Pigeon Serum Abs: NEGATIVE
Thermoact. Saccharii: NEGATIVE
Thermoactinomyces vulgaris, IgG: NEGATIVE

## 2022-10-04 LAB — RNP ANTIBODIES: ENA RNP Ab: 0.5 AI (ref 0.0–0.9)

## 2022-10-06 NOTE — Progress Notes (Signed)
Gave patient results, she acknowledged understanding and had no further questions.

## 2022-10-06 NOTE — Progress Notes (Signed)
Called patient no answer left a vm

## 2022-10-06 NOTE — Progress Notes (Signed)
normal

## 2022-10-14 ENCOUNTER — Other Ambulatory Visit: Payer: Medicare HMO

## 2022-10-14 ENCOUNTER — Ambulatory Visit: Payer: Medicare HMO

## 2022-10-14 DIAGNOSIS — I2510 Atherosclerotic heart disease of native coronary artery without angina pectoris: Secondary | ICD-10-CM | POA: Diagnosis not present

## 2022-10-14 DIAGNOSIS — I10 Essential (primary) hypertension: Secondary | ICD-10-CM | POA: Diagnosis not present

## 2022-10-14 DIAGNOSIS — I6523 Occlusion and stenosis of bilateral carotid arteries: Secondary | ICD-10-CM | POA: Diagnosis not present

## 2022-10-14 DIAGNOSIS — I252 Old myocardial infarction: Secondary | ICD-10-CM | POA: Diagnosis not present

## 2022-10-14 DIAGNOSIS — I714 Abdominal aortic aneurysm, without rupture, unspecified: Secondary | ICD-10-CM

## 2022-10-14 DIAGNOSIS — E782 Mixed hyperlipidemia: Secondary | ICD-10-CM

## 2022-10-14 DIAGNOSIS — R9431 Abnormal electrocardiogram [ECG] [EKG]: Secondary | ICD-10-CM

## 2022-10-14 DIAGNOSIS — E785 Hyperlipidemia, unspecified: Secondary | ICD-10-CM | POA: Diagnosis not present

## 2022-10-16 NOTE — Progress Notes (Signed)
Called no answer left a vm

## 2022-10-16 NOTE — Progress Notes (Signed)
unchanged

## 2022-10-17 NOTE — Progress Notes (Signed)
Called patient to inform her about her stress test. Patient understood.

## 2022-11-03 ENCOUNTER — Ambulatory Visit: Payer: Medicare HMO

## 2022-11-03 DIAGNOSIS — E782 Mixed hyperlipidemia: Secondary | ICD-10-CM

## 2022-11-03 DIAGNOSIS — I10 Essential (primary) hypertension: Secondary | ICD-10-CM

## 2022-11-03 DIAGNOSIS — I6523 Occlusion and stenosis of bilateral carotid arteries: Secondary | ICD-10-CM | POA: Diagnosis not present

## 2022-11-03 DIAGNOSIS — I252 Old myocardial infarction: Secondary | ICD-10-CM | POA: Diagnosis not present

## 2022-11-03 DIAGNOSIS — R9431 Abnormal electrocardiogram [ECG] [EKG]: Secondary | ICD-10-CM

## 2022-11-03 DIAGNOSIS — I2510 Atherosclerotic heart disease of native coronary artery without angina pectoris: Secondary | ICD-10-CM

## 2022-11-03 DIAGNOSIS — I714 Abdominal aortic aneurysm, without rupture, unspecified: Secondary | ICD-10-CM | POA: Diagnosis not present

## 2022-11-10 ENCOUNTER — Encounter: Payer: Self-pay | Admitting: Internal Medicine

## 2022-11-10 ENCOUNTER — Ambulatory Visit: Payer: Medicare HMO | Admitting: Internal Medicine

## 2022-11-10 VITALS — BP 134/61 | HR 80 | Ht 62.0 in | Wt 139.0 lb

## 2022-11-10 DIAGNOSIS — I2510 Atherosclerotic heart disease of native coronary artery without angina pectoris: Secondary | ICD-10-CM | POA: Diagnosis not present

## 2022-11-10 DIAGNOSIS — I714 Abdominal aortic aneurysm, without rupture, unspecified: Secondary | ICD-10-CM | POA: Diagnosis not present

## 2022-11-10 DIAGNOSIS — E782 Mixed hyperlipidemia: Secondary | ICD-10-CM

## 2022-11-10 DIAGNOSIS — I10 Essential (primary) hypertension: Secondary | ICD-10-CM | POA: Diagnosis not present

## 2022-11-10 DIAGNOSIS — I6523 Occlusion and stenosis of bilateral carotid arteries: Secondary | ICD-10-CM | POA: Diagnosis not present

## 2022-11-10 NOTE — Progress Notes (Signed)
Primary Physician/Referring:  Henrine Screws, MD  Patient ID: Debra Dennis, female    DOB: 07/04/1956, 67 y.o.   MRN: [id number]  Chief Complaint  Patient presents with   Coronary artery calcification seen on CT scan   Follow-up   Results   HPI:    Debra Dennis  is a 67 y.o. female who is a vasculopath given her history of CAD, carotid stenosis, and AAA status postrepair who is here for a follow-up visit. She has been feeling well since our last visit. No cardiac concerns or complaints today. Overall she feels good but she does have back pain due to some herniated discs. She says the pain is not too bad and she does not have to take anything for it. Patient denies chest pain, palpitations, diaphoresis, syncope, edema, orthopnea, PND.  Past Medical History:  Diagnosis Date   Bilateral carotid artery disease (HCC)    CAD (coronary artery disease)    Claudication (HCC)    Diabetes mellitus (HCC)    GERD (gastroesophageal reflux disease)    Hyperlipidemia    Hypertension    Myocardial infarction (HCC)    Restless leg    Shingles 2011   Stress shingles    TIA (transient ischemic attack)    X 2   Past Surgical History:  Procedure Laterality Date   AORTA - BILATERAL FEMORAL ARTERY BYPASS GRAFT Bilateral 04/18/2016   Procedure: AORTOBIFEMORAL BYPASS GRAFT;  Surgeon: Nada Libman, MD;  Location: [facility] OR;  Service: Vascular;  Laterality: Bilateral;   APPENDECTOMY     CARDIAC CATHETERIZATION     COLONOSCOPY     CRANIOTOMY     CYST EXCISION PERINEAL     HEEL SPUR RESECTION     PERIPHERAL VASCULAR CATHETERIZATION Bilateral 11/05/2015   Procedure: Lower Extremity Angiography;  Surgeon: Runell Gess, MD;  Location: [facility] INVASIVE CV LAB;  Service: Cardiovascular;  Laterality: Bilateral;   PERIPHERAL VASCULAR CATHETERIZATION N/A 11/05/2015   Procedure: Abdominal Aortogram;  Surgeon: Runell Gess, MD;  Location: [facility] INVASIVE CV LAB;  Service: Cardiovascular;  Laterality: N/A;   TUBAL  LIGATION     Family History  Problem Relation Age of Onset   Sjogren's syndrome Mother    Heart attack Father 104       First one   CAD Father    Diabetes Sister    Celiac disease Sister    Celiac disease Sister    Diabetes Brother    Cancer Maternal Grandmother    Heart attack Paternal Grandmother    CAD Son     Social History   Tobacco Use   Smoking status: Some Days    Packs/day: 0.50    Years: 40.00    Additional pack years: 0.00    Total pack years: 20.00    Types: Cigarettes   Smokeless tobacco: Former  Substance Use Topics   Alcohol use: No    Alcohol/week: 0.0 standard drinks of alcohol   Marital Status: Widowed  ROS  Review of Systems  Cardiovascular:  Negative for dyspnea on exertion.  Respiratory:  Negative for shortness of breath.   Musculoskeletal:  Positive for back pain.   Objective  Blood pressure 134/61, pulse 80, height 5\' 2"  (1.575 m), weight 139 lb (63 kg), SpO2 96 %. Body mass index is 25.42 kg/m.     11/10/2022    1:29 PM 09/25/2022    3:02 PM 09/08/2022    1:13 PM  Vitals with BMI  Height 5\' 2"  5'  2" 5\' 2"   Weight 139 lbs 132 lbs 138 lbs 13 oz  BMI 25.42 24.14 25.38  Systolic 134 120 540  Diastolic 61 62 64  Pulse 80 86 89     Physical Exam Vitals reviewed.  HENT:     Head: Normocephalic and atraumatic.  Neck:     Vascular: Carotid bruit present.  Cardiovascular:     Rate and Rhythm: Normal rate and regular rhythm.     Heart sounds: Murmur heard.  Pulmonary:     Effort: Pulmonary effort is normal.     Breath sounds: Normal breath sounds.  Abdominal:     General: Bowel sounds are normal.  Musculoskeletal:     Right lower leg: No edema.     Left lower leg: No edema.  Skin:    General: Skin is warm and dry.  Neurological:     Mental Status: She is alert.     Medications and allergies   Allergies  Allergen Reactions   Carbamazepine Other (See Comments)    ABNORMAL LABS ELEVATION OF LIVER ENZYMES    Dilantin [Phenytoin]  Hives   Metoprolol Other (See Comments)    "Cause my heart to pause"     Medication list after today's encounter   Current Outpatient Medications:    amLODipine (NORVASC) 5 MG tablet, Take 5 mg by mouth daily., Disp: , Rfl:    artificial tears (LACRILUBE) OINT ophthalmic ointment, Place 1 application into the left eye at bedtime as needed (for dry/red/irritated eyes.)., Disp: , Rfl:    aspirin EC 81 MG tablet, Take 81 mg by mouth daily., Disp: , Rfl:    cyclobenzaprine (FLEXERIL) 10 MG tablet, Take 10 mg by mouth 3 (three) times daily as needed., Disp: , Rfl:    Docusate Sodium (COLACE PO), Take 100 mg by mouth 2 (two) times daily. , Disp: , Rfl:    FARXIGA 10 MG TABS tablet, Take 10 mg by mouth daily., Disp: , Rfl:    ferrous sulfate 324 MG TBEC, Take 324 mg by mouth., Disp: , Rfl:    Loratadine (CLARITIN) 10 MG CAPS, Take 10 mg by mouth daily., Disp: , Rfl:    metFORMIN (GLUCOPHAGE) 500 MG tablet, Take 500 mg by mouth 2 (two) times daily., Disp: , Rfl:    nitroGLYCERIN (NITROSTAT) 0.4 MG SL tablet, Place 1 tablet under the tongue every 5 (five) minutes as needed for chest pain. , Disp: , Rfl:    omeprazole (PRILOSEC) 20 MG capsule, Take 20 mg by mouth 2 (two) times daily., Disp: , Rfl:    PARoxetine (PAXIL) 10 MG tablet, Take 10 mg by mouth daily., Disp: , Rfl:    rOPINIRole (REQUIP) 0.25 MG tablet, Take 0.25 mg by mouth 3 (three) times daily as needed. For leg cramps/restless legs., Disp: , Rfl:    rosuvastatin (CRESTOR) 40 MG tablet, Take 1 tablet (40 mg total) by mouth daily., Disp: 30 tablet, Rfl: 6   Vitamin D, Ergocalciferol, (DRISDOL) 1.25 MG (50000 UNIT) CAPS capsule, Take 50,000 Units by mouth every 7 (seven) days., Disp: , Rfl:   Laboratory examination:   Lab Results  Component Value Date   NA 139 04/23/2016   K 3.3 (L) 04/23/2016   CO2 28 04/23/2016   GLUCOSE 128 (H) 04/23/2016   BUN 6 04/23/2016   CREATININE 0.59 04/23/2016   CALCIUM 8.9 04/23/2016   GFRNONAA >60  04/23/2016       Latest Ref Rng & Units 04/23/2016    2:52 AM  04/22/2016    2:18 AM 04/21/2016    3:09 AM  CMP  Glucose 65 - 99 mg/dL 161  096  045   BUN 6 - 20 mg/dL 6  5  5    Creatinine 0.44 - 1.00 mg/dL 4.09  8.11  9.14   Sodium 135 - 145 mmol/L 139  137  138   Potassium 3.5 - 5.1 mmol/L 3.3  3.6  4.0   Chloride 101 - 111 mmol/L 102  104  105   CO2 22 - 32 mmol/L 28  25  28    Calcium 8.9 - 10.3 mg/dL 8.9  8.5  8.3       Latest Ref Rng & Units 04/23/2016    2:52 AM 04/22/2016    2:18 AM 04/21/2016    3:09 AM  CBC  WBC 4.0 - 10.5 K/uL 7.3  7.7  9.3   Hemoglobin 12.0 - 15.0 g/dL 8.4  7.6  7.6   Hematocrit 36.0 - 46.0 % 27.5  25.1  25.6   Platelets 150 - 400 K/uL 300  233  197     Lipid Panel No results for input(s): "CHOL", "TRIG", "LDLCALC", "VLDL", "HDL", "CHOLHDL", "LDLDIRECT" in the last 8760 hours.  HEMOGLOBIN A1C Lab Results  Component Value Date   HGBA1C 7.0 (H) 04/11/2016   MPG 154 04/11/2016   TSH No results for input(s): "TSH" in the last 8760 hours.  External labs:   Labs 07/18/2022: TIBC 487 Iron 26 Iron sat 5% Transferrin 348 ferritin 6.9 HGB 8.8 HCT 29.5 PLT 356 FOBT positive  Radiology:   08/29/2022 LDCT IMPRESSION: 1. Lung-RADS 2S, benign appearance or behavior. Continue annual screening with low-dose chest CT without contrast in 12 months. 2. The "S" modifier above refers to potentially clinically significant non lung cancer related findings. Specifically, there is aortic atherosclerosis, in addition to left main and three-vessel coronary artery disease. Please note that although the presence of coronary artery calcium documents the presence of coronary artery disease, the severity of this disease and any potential stenosis cannot be assessed on this non-gated CT examination. Assessment for potential risk factor modification, dietary therapy or pharmacologic therapy may be warranted, if clinically indicated. 3. There are also findings in the lungs  concerning for interstitial lung disease, with a spectrum of findings considered most likely to reflect an alternative diagnosis (not usual interstitial pneumonia) at this time. Outpatient referral to Pulmonology for further clinical evaluation is recommended. 4. Mild diffuse bronchial wall thickening with very mild centrilobular and paraseptal emphysema; imaging findings suggestive of underlying COPD.   Cardiac Studies:   Regadenoson Nuclear stress test 10/02/2022: Myocardial perfusion is without ischemia. There is a soft tissue attenuation defect in the inferior and apical regions.  Overall LV systolic function is normal without regional wall motion abnormalities. Stress LV EF: 63%.  Non diagnostic stress EKG. The heart rate response was consistent with Regadenoson.  No previous exam available for comparison. Low risk.    Carotid artery duplex 10/14/2022:  Duplex suggests stenosis in the right internal carotid artery (16-49%).  Duplex suggests stenosis in the left internal carotid artery (50-69%).  <50% stenosis in the left external carotid artery.  Antegrade right vertebral artery flow. Antegrade left vertebral artery  flow.  Compared with external study review on 12/22/2017, no significant change.  Follow up in six months is appropriate if clinically indicated.    Echocardiogram 10/14/2022: Normal LV systolic function with visual EF 60-65%. Left ventricle cavity is normal in size. Mild concentric hypertrophy  of the left ventricle. Normal global wall motion. Normal diastolic filling pattern, normal LAP. Calculated EF 75%. Structurally normal trileaflet aortic valve.  Mild (Grade I) aortic regurgitation. Structurally normal tricuspid valve with no regurgitation. No evidence of pulmonary hypertension.    EKG:   09/08/2022: Sinus Rhythm, rate 76 bpm, with LAE.  Old anteroseptal infarct.  Assessment     ICD-10-CM   1. Bilateral carotid artery stenosis  I65.23 PCV CAROTID DUPLEX  (BILATERAL)    2. Essential hypertension  I10     3. Mixed hyperlipidemia  E78.2     4. Abdominal aortic aneurysm (AAA) without rupture, unspecified part (HCC)  I71.40     5. Coronary artery disease involving native coronary artery of native heart without angina pectoris  I25.10        No orders of the defined types were placed in this encounter.   No orders of the defined types were placed in this encounter.   Medications Discontinued During This Encounter  Medication Reason   Omega-3 Fatty Acids (FISH OIL) 1000 MG CAPS Completed Course   Multiple Vitamin (MULTIVITAMIN WITH MINERALS) TABS tablet Completed Course      Recommendations:   Debra Dennis is a 67 y.o.  female who is a vasculopath s/p many interventions    Coronary artery disease involving native coronary artery of native heart without angina pectoris MI, old Echocardiogram and stress test negative for ischemia Patient had stent placed 8+ years ago She completed ASA and Brillinta course, now just on aspirin   Bilateral carotid artery stenosis Carotid duplex completed  Will repeat in 6 months   Essential hypertension Continue current cardiac medications. Encourage low-sodium diet, less than 2000 mg daily. BP very well controlled on current regiment   Mixed hyperlipidemia Continue Crestor 40 mg daily   Abdominal aortic aneurysm (AAA) without rupture, s/p surgery about 5-6 years ago AAA ultrasound completed, final read is pending No episodes of abd pain, leg pain, or claudication since AAA was repaired Follow-up in 6 months or sooner if needed     Clotilde Dieter, DO, [facility]  11/10/2022, 1:45 PM Office: [phone] Pager: [phone]

## 2022-11-13 NOTE — Progress Notes (Signed)
LMTCB

## 2022-11-13 NOTE — Progress Notes (Signed)
normal

## 2022-11-13 NOTE — Progress Notes (Signed)
Pt aware.//ah

## 2022-11-25 DIAGNOSIS — D509 Iron deficiency anemia, unspecified: Secondary | ICD-10-CM | POA: Diagnosis not present

## 2022-12-05 ENCOUNTER — Ambulatory Visit: Payer: No Typology Code available for payment source | Admitting: Pulmonary Disease

## 2022-12-11 ENCOUNTER — Other Ambulatory Visit: Payer: No Typology Code available for payment source

## 2023-01-12 DIAGNOSIS — D509 Iron deficiency anemia, unspecified: Secondary | ICD-10-CM | POA: Diagnosis not present

## 2023-01-14 ENCOUNTER — Other Ambulatory Visit: Payer: No Typology Code available for payment source

## 2023-01-26 DIAGNOSIS — D509 Iron deficiency anemia, unspecified: Secondary | ICD-10-CM | POA: Diagnosis not present

## 2023-02-02 DIAGNOSIS — M1990 Unspecified osteoarthritis, unspecified site: Secondary | ICD-10-CM | POA: Diagnosis not present

## 2023-02-02 DIAGNOSIS — N1831 Chronic kidney disease, stage 3a: Secondary | ICD-10-CM | POA: Diagnosis not present

## 2023-02-02 DIAGNOSIS — M810 Age-related osteoporosis without current pathological fracture: Secondary | ICD-10-CM | POA: Diagnosis not present

## 2023-02-02 DIAGNOSIS — K5900 Constipation, unspecified: Secondary | ICD-10-CM | POA: Diagnosis not present

## 2023-02-02 DIAGNOSIS — I2510 Atherosclerotic heart disease of native coronary artery without angina pectoris: Secondary | ICD-10-CM | POA: Diagnosis not present

## 2023-02-02 DIAGNOSIS — F325 Major depressive disorder, single episode, in full remission: Secondary | ICD-10-CM | POA: Diagnosis not present

## 2023-02-02 DIAGNOSIS — E559 Vitamin D deficiency, unspecified: Secondary | ICD-10-CM | POA: Diagnosis not present

## 2023-02-02 DIAGNOSIS — K219 Gastro-esophageal reflux disease without esophagitis: Secondary | ICD-10-CM | POA: Diagnosis not present

## 2023-02-02 DIAGNOSIS — Z008 Encounter for other general examination: Secondary | ICD-10-CM | POA: Diagnosis not present

## 2023-02-02 DIAGNOSIS — E785 Hyperlipidemia, unspecified: Secondary | ICD-10-CM | POA: Diagnosis not present

## 2023-02-02 DIAGNOSIS — E1122 Type 2 diabetes mellitus with diabetic chronic kidney disease: Secondary | ICD-10-CM | POA: Diagnosis not present

## 2023-02-02 DIAGNOSIS — F419 Anxiety disorder, unspecified: Secondary | ICD-10-CM | POA: Diagnosis not present

## 2023-02-02 DIAGNOSIS — I129 Hypertensive chronic kidney disease with stage 1 through stage 4 chronic kidney disease, or unspecified chronic kidney disease: Secondary | ICD-10-CM | POA: Diagnosis not present

## 2023-03-11 ENCOUNTER — Telehealth: Payer: Self-pay | Admitting: Adult Health

## 2023-03-11 ENCOUNTER — Ambulatory Visit (INDEPENDENT_AMBULATORY_CARE_PROVIDER_SITE_OTHER): Payer: Medicare HMO | Admitting: Pulmonary Disease

## 2023-03-11 DIAGNOSIS — J849 Interstitial pulmonary disease, unspecified: Secondary | ICD-10-CM | POA: Diagnosis not present

## 2023-03-11 LAB — PULMONARY FUNCTION TEST
DL/VA % pred: 95 %
DL/VA: 3.99 ml/min/mmHg/L
DLCO cor % pred: 48 %
DLCO cor: 9.22 ml/min/mmHg
DLCO unc % pred: 48 %
DLCO unc: 9.22 ml/min/mmHg
FEF 25-75 Pre: 0.75 L/s
FEF2575-%Pred-Pre: 39 %
FEV1-%Pred-Pre: 54 %
FEV1-Pre: 1.2 L
FEV1FVC-%Pred-Pre: 88 %
FEV6-%Pred-Pre: 63 %
FEV6-Pre: 1.78 L
FEV6FVC-%Pred-Pre: 104 %
FVC-%Pred-Pre: 61 %
FVC-Pre: 1.78 L
Pre FEV1/FVC ratio: 68 %
Pre FEV6/FVC Ratio: 100 %

## 2023-03-11 NOTE — Telephone Encounter (Signed)
solved

## 2023-03-11 NOTE — Patient Instructions (Signed)
Spiro/DLCO performed today. 

## 2023-03-11 NOTE — Progress Notes (Signed)
Spiro/DLCO performed today. 

## 2023-03-11 NOTE — Telephone Encounter (Signed)
PT missed appt  today for PFT. Did not like the drive. Can we sched her PFT at [facility]? Please call PT to advise. [phone]

## 2023-03-12 ENCOUNTER — Encounter: Payer: Self-pay | Admitting: Adult Health

## 2023-03-12 ENCOUNTER — Ambulatory Visit (INDEPENDENT_AMBULATORY_CARE_PROVIDER_SITE_OTHER): Payer: Medicare HMO | Admitting: Adult Health

## 2023-03-12 VITALS — BP 98/58 | HR 95 | Temp 98.6°F | Ht 63.0 in | Wt 138.4 lb

## 2023-03-12 DIAGNOSIS — J449 Chronic obstructive pulmonary disease, unspecified: Secondary | ICD-10-CM | POA: Diagnosis not present

## 2023-03-12 DIAGNOSIS — Z720 Tobacco use: Secondary | ICD-10-CM | POA: Diagnosis not present

## 2023-03-12 DIAGNOSIS — Z23 Encounter for immunization: Secondary | ICD-10-CM | POA: Diagnosis not present

## 2023-03-12 DIAGNOSIS — J849 Interstitial pulmonary disease, unspecified: Secondary | ICD-10-CM | POA: Diagnosis not present

## 2023-03-12 MED ORDER — ALBUTEROL SULFATE HFA 108 (90 BASE) MCG/ACT IN AERS
1.0000 | INHALATION_SPRAY | Freq: Four times a day (QID) | RESPIRATORY_TRACT | 2 refills | Status: DC | PRN
Start: 2023-03-12 — End: 2023-03-17

## 2023-03-12 MED ORDER — ANORO ELLIPTA 62.5-25 MCG/ACT IN AEPB: 1.0000 | INHALATION_SPRAY | Freq: Every day | RESPIRATORY_TRACT | 5 refills | Status: DC

## 2023-03-12 NOTE — Progress Notes (Signed)
Patient seen in the office today and instructed on use of Anoro.  Patient expressed understanding and demonstrated technique. 

## 2023-03-12 NOTE — Assessment & Plan Note (Signed)
Smoking cessation  

## 2023-03-12 NOTE — Patient Instructions (Addendum)
Begin Anoro 1 puff daily.  Albuterol inhaler 1-2 puffs every 6hrs as needed.  Work on not smoking.  Activity as tolerated.  CT Chest as planned in February 2025  Flu shot today  Follow up with Dr. Francine Graven in 3 month and As needed

## 2023-03-12 NOTE — Assessment & Plan Note (Addendum)
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  Smoking cessation . Minimal symptoms.  Consider changing LDCT to HRCT in 08/2023  Would repeat Spirometry with DLCO in 6 monhs

## 2023-03-12 NOTE — Assessment & Plan Note (Addendum)
Moderate COPD with Emphysema - moderate obstruction and restriction on PFT  Smoking cessation discussed.  Trial of ANORO daily   Plan  Patient Instructions  Begin Anoro 1 puff daily.  Albuterol inhaler 1-2 puffs every 6hrs as needed.  Work on not smoking.  Activity as tolerated.  CT Chest as planned in February 2025  Follow up with Dr. Francine Graven in 3 month and As needed

## 2023-03-12 NOTE — Addendum Note (Signed)
Addended by: Delrae Rend on: 03/12/2023 02:24 PM   Modules accepted: Orders

## 2023-03-12 NOTE — Progress Notes (Signed)
@Patient  ID: Debra Dennis, female    DOB: 06-24-1956, 67 y.o.   MRN: [id number]  Chief Complaint  Patient presents with   Follow-up    Referring provider: Hart Robinsons, DO  HPI: 67 year old female active smoker seen for pulmonary consult September 25, 2022 for abnormal CT chest Participates in the lung cancer CT screening program  TEST/EVENTS :  LCS CT Chest 08/29/22 which showed LLL pulmonary nodule measuring 5.31mm. Mild diffuse bronchial wall thickening with very mild centrilobular and paraseptal emphysema. Patchy areas of scattered ground glass attenuation, septal thickening and subpleural reticulation are randomly distributed throughout the lungs bilaterally with no craniocaudal gradient.  Concerning for possible NSIP  PFTs May 11, 2023 showed moderate obstruction and restriction with an FEV1 of 54% ratio at 68, FVC 61%, DLCO 48%  Autoimmune/connective tissue serology March 2024 showed mildly positive ANA and P ANCA otherwise negative for antiscleroderma, anti-Smith, double-stranded DNA , CCP, hypersensitivity panel, RA factor, RNP antibodies and Sjogren's  03/12/2023 Follow up : ILD , Emphysema  Patient returns for a 31-month follow-up.  Patient was seen last visit for pulmonary consult.  She is an active smoker.  Participates in the lung cancer CT screening program.  She had a CT chest done in February 2024 that showed small left lower lobe nodule measuring 5.2 mm.  She has a planned serial follow-up in for a 2024.  CT chest did show mild emphysema, scattered groundglass attenuation with septal thickening and subpleural reticulation concerning for interstitial lung disease considered not UIP.  Concerning for NSIP.  She was set up for autoimmune and connective tissue serology.  This was essentially negative.  There was a mildly positive ANA and p-ANCA otherwise unrevealing.  She has no active symptoms of rash or joint swelling.  Does have dry eyes Sjogren's was negative.  She was set up  for PFTs that were done yesterday that shows moderate obstruction and restriction.  FEV1 at 54%.,  Ratio 68, FVC 61%, DLCO 48%. She is not on any inhalers.  No previous diagnosis of COPD. Has minimal dyspnea, mainly gets winded with going up stairs and incline. Daily cough, minimally productive.  She is still smoking.  We discussed smoking cessation in detail.    Allergies  Allergen Reactions   Carbamazepine Other (See Comments)    ABNORMAL LABS ELEVATION OF LIVER ENZYMES    Dilantin [Phenytoin] Hives   Metoprolol Other (See Comments)    "Cause my heart to pause"     There is no immunization history on file for this patient.  Past Medical History:  Diagnosis Date   Bilateral carotid artery disease (HCC)    CAD (coronary artery disease)    Claudication (HCC)    Diabetes mellitus (HCC)    GERD (gastroesophageal reflux disease)    Hyperlipidemia    Hypertension    Myocardial infarction (HCC)    Restless leg    Shingles 2011   Stress shingles    TIA (transient ischemic attack)    X 2    Tobacco History: Social History   Tobacco Use  Smoking Status Some Days   Current packs/day: 0.50   Average packs/day: 0.5 packs/day for 40.0 years (20.0 ttl pk-yrs)   Types: Cigarettes  Smokeless Tobacco Former  Tobacco Comments   1 ppd, trying to quit.  Patch was not keeping from smoking.  Wellbutrin did not help.  03/12/23 hfb   Ready to quit: Yes Counseling given: Yes Tobacco comments: 1 ppd, trying to quit.  Patch  was not keeping from smoking.  Wellbutrin did not help.  03/12/23 hfb   Outpatient Medications Prior to Visit  Medication Sig Dispense Refill   artificial tears (LACRILUBE) OINT ophthalmic ointment Place 1 application into the left eye at bedtime as needed (for dry/red/irritated eyes.).     aspirin EC 81 MG tablet Take 81 mg by mouth daily.     cyclobenzaprine (FLEXERIL) 10 MG tablet Take 10 mg by mouth 3 (three) times daily as needed.     Docusate Sodium (COLACE PO)  Take 100 mg by mouth 2 (two) times daily.      FARXIGA 10 MG TABS tablet Take 10 mg by mouth daily.     ferrous sulfate 324 MG TBEC Take 324 mg by mouth.     Loratadine (CLARITIN) 10 MG CAPS Take 10 mg by mouth daily.     metFORMIN (GLUCOPHAGE) 500 MG tablet Take 500 mg by mouth 2 (two) times daily.     omeprazole (PRILOSEC) 20 MG capsule Take 20 mg by mouth 2 (two) times daily.     PARoxetine (PAXIL) 10 MG tablet Take 10 mg by mouth daily.     rOPINIRole (REQUIP) 0.25 MG tablet Take 0.25 mg by mouth 3 (three) times daily as needed. For leg cramps/restless legs.     rosuvastatin (CRESTOR) 40 MG tablet Take 1 tablet (40 mg total) by mouth daily. 30 tablet 6   Vitamin D, Ergocalciferol, (DRISDOL) 1.25 MG (50000 UNIT) CAPS capsule Take 50,000 Units by mouth every 7 (seven) days.     amLODipine (NORVASC) 5 MG tablet Take 5 mg by mouth daily.     nitroGLYCERIN (NITROSTAT) 0.4 MG SL tablet Place 1 tablet under the tongue every 5 (five) minutes as needed for chest pain.      No facility-administered medications prior to visit.     Review of Systems:   Constitutional:   No  weight loss, night sweats,  Fevers, chills,  +fatigue, or  lassitude.  HEENT:   No headaches,  Difficulty swallowing,  Tooth/dental problems, or  Sore throat,                No sneezing, itching, ear ache, nasal congestion, post nasal drip,   CV:  No chest pain,  Orthopnea, PND, swelling in lower extremities, anasarca, dizziness, palpitations, syncope.   GI  No heartburn, indigestion, abdominal pain, nausea, vomiting, diarrhea, change in bowel habits, loss of appetite, bloody stools.   Resp:   No chest wall deformity  Skin: no rash or lesions.  GU: no dysuria, change in color of urine, no urgency or frequency.  No flank pain, no hematuria   MS:  No joint pain or swelling.  No decreased range of motion.  No back pain.    Physical Exam  BP (!) 98/58 (BP Location: Left Arm, Patient Position: Sitting, Cuff Size:  Normal)   Pulse 95   Temp 98.6 F (37 C) (Oral)   Ht 5\' 3"  (1.6 m)   Wt 138 lb 6.4 oz (62.8 kg)   SpO2 94%   BMI 24.52 kg/m   GEN: A/Ox3; pleasant , NAD, well nourished    HEENT:  [place]/AT,  EACs-clear, TMs-wnl, NOSE-clear, THROAT-clear, no lesions, no postnasal drip or exudate noted.   NECK:  Supple w/ fair ROM; no JVD; normal carotid impulses w/o bruits; no thyromegaly or nodules palpated; no lymphadenopathy.    RESP bibasilar crackles no accessory muscle use, no dullness to percussion  CARD:  RRR, no m/r/g, no  peripheral edema, pulses intact, no cyanosis or clubbing.  GI:   Soft & nt; nml bowel sounds; no organomegaly or masses detected.   Musco: Warm bil, no deformities or joint swelling noted.   Neuro: alert, no focal deficits noted.    Skin: Warm, no lesions or rashes    Lab Results:  CBC   BMET   BNP No results found for: "BNP"  ProBNP No results found for: "PROBNP"  Imaging: No results found.  Administration History     None          Latest Ref Rng & Units 03/11/2023    2:15 PM  PFT Results  FVC-Pre L 1.78  P  FVC-Predicted Pre % 61  P  Pre FEV1/FVC % % 68  P  FEV1-Pre L 1.20  P  FEV1-Predicted Pre % 54  P  DLCO uncorrected ml/min/mmHg 9.22  P  DLCO UNC% % 48  P  DLCO corrected ml/min/mmHg 9.22  P  DLCO COR %Predicted % 48  P  DLVA Predicted % 95  P    P Preliminary result    No results found for: "NITRICOXIDE"      Assessment & Plan:   COPD (chronic obstructive pulmonary disease) (HCC) Moderate COPD with Emphysema - moderate obstruction and restriction on PFT  Smoking cessation discussed.  Trial of ANORO daily   Plan  Patient Instructions  Begin Anoro 1 puff daily.  Albuterol inhaler 1-2 puffs every 6hrs as needed.  Work on not smoking.  Activity as tolerated.  CT Chest as planned in February 2025  Follow up with Dr. Francine Graven in 3 month and As needed      ILD (interstitial lung disease) (HCC) ILD changes on CT chest  concerning for NSIP (Not UIP) Autoimmune /CTD essentially neg.  Smoking cessation . Minimal symptoms.  Consider changing LDCT to HRCT in 08/2023  Would repeat Spirometry with DLCO in 6 monhs    Tobacco abuse Smoking cessation     Rubye Oaks, NP 03/12/2023

## 2023-03-16 ENCOUNTER — Telehealth: Payer: Self-pay | Admitting: Adult Health

## 2023-03-16 NOTE — Telephone Encounter (Signed)
Patient states Albuterol & Anoro Ellipta inhalers need to be sent to different pharmacy. Pharmacy is [facility] mail order. Patient phone number is [phone].

## 2023-03-17 DIAGNOSIS — D509 Iron deficiency anemia, unspecified: Secondary | ICD-10-CM | POA: Diagnosis not present

## 2023-03-17 MED ORDER — ANORO ELLIPTA 62.5-25 MCG/ACT IN AEPB: 1.0000 | INHALATION_SPRAY | Freq: Every day | RESPIRATORY_TRACT | 3 refills | Status: DC

## 2023-03-17 MED ORDER — ALBUTEROL SULFATE HFA 108 (90 BASE) MCG/ACT IN AERS: 1.0000 | INHALATION_SPRAY | Freq: Four times a day (QID) | RESPIRATORY_TRACT | 3 refills | Status: AC | PRN

## 2023-03-17 NOTE — Telephone Encounter (Signed)
Rx's sent 90 days supply to [facility]  I spoke with the pt and notified her that this was done  Nothing further needed

## 2023-03-17 NOTE — Telephone Encounter (Signed)
PT calling again. We sent to wrong pharm. Please see note below and correct.

## 2023-03-18 ENCOUNTER — Ambulatory Visit
Admission: RE | Admit: 2023-03-18 | Discharge: 2023-03-18 | Disposition: A | Discharge status: Home / Self Care | Payer: Medicare HMO | Source: Ambulatory Visit | Attending: Family Medicine | Admitting: Family Medicine

## 2023-03-18 DIAGNOSIS — K8020 Calculus of gallbladder without cholecystitis without obstruction: Secondary | ICD-10-CM

## 2023-05-27 ENCOUNTER — Other Ambulatory Visit: Payer: Self-pay | Admitting: Family Medicine

## 2023-05-27 DIAGNOSIS — N289 Disorder of kidney and ureter, unspecified: Secondary | ICD-10-CM

## 2023-06-11 ENCOUNTER — Ambulatory Visit: Payer: No Typology Code available for payment source | Admitting: Pulmonary Disease

## 2023-06-11 NOTE — Progress Notes (Unsigned)
Synopsis: Referred in March 2024 for abnormal CT Chest scan by Henrine Screws, MD  Subjective:   PATIENT ID: Debra Dennis GENDER: female DOB: 07/11/1955, MRN: [id number]  HPI  No chief complaint on file.  Debra Dennis is a 67 year old woman, daily smoker with DMII, GERD, CAD and TIA who is referred to pulmonary clinic for abnormal chest imaging.   She had LCS CT Chest 08/29/22 which showed LLL pulmonary nodule measuring 5.27mm. Mild diffuse bronchial wall thickening with very mild centrilobular and paraseptal emphysema. Patchy areas of scattered ground glass attenuation, septal thickening and subpleural reticulation are randomly distributed throughout the lungs bilaterally with no craniocaudal gradient.   She has been undergoing evaluation for anemia. She had colonoscopy and is now going for capsule endoscopy through [facility].   Her left eye is dry from trigeminal neuralgia procedure in the past. She has had dry mouth for many years.   She has a joint pain in first digit.   She is smoking 0.5 to 1 pack per day for 45 years. She is a retired a Engineer, civil (consulting).   Mother had sjogren's disease   Past Medical History:  Diagnosis Date   Bilateral carotid artery disease (HCC)    CAD (coronary artery disease)    Claudication (HCC)    Diabetes mellitus (HCC)    GERD (gastroesophageal reflux disease)    Hyperlipidemia    Hypertension    Myocardial infarction (HCC)    Restless leg    Shingles 2011   Stress shingles    TIA (transient ischemic attack)    X 2     Family History  Problem Relation Age of Onset   Sjogren's syndrome Mother    Heart attack Father 82       First one   CAD Father    Diabetes Sister    Celiac disease Sister    Celiac disease Sister    Diabetes Brother    Cancer Maternal Grandmother    Heart attack Paternal Grandmother    CAD Son      Social History   Socioeconomic History   Marital status: Widowed    Spouse name: Not on file   Number of children: 2    Years of education: Not on file   Highest education level: Not on file  Occupational History   Not on file  Tobacco Use   Smoking status: Some Days    Current packs/day: 0.50    Average packs/day: 0.5 packs/day for 40.0 years (20.0 ttl pk-yrs)    Types: Cigarettes   Smokeless tobacco: Former   Tobacco comments:    1 ppd, trying to quit.  Patch was not keeping from smoking.  Wellbutrin did not help.  03/12/23 hfb  Vaping Use   Vaping status: Never Used  Substance and Sexual Activity   Alcohol use: No    Alcohol/week: 0.0 standard drinks of alcohol   Drug use: No   Sexual activity: Not on file  Other Topics Concern   Not on file  Social History Narrative   Not on file   Social Determinants of Health   Financial Resource Strain: Not on file  Food Insecurity: Not on file  Transportation Needs: Not on file  Physical Activity: Not on file  Stress: Not on file  Social Connections: Not on file  Intimate Partner Violence: Not on file     Allergies  Allergen Reactions   Carbamazepine Other (See Comments)    ABNORMAL LABS ELEVATION OF LIVER  ENZYMES    Dilantin [Phenytoin] Hives   Metoprolol Other (See Comments)    "Cause my heart to pause"     Outpatient Medications Prior to Visit  Medication Sig Dispense Refill   albuterol (VENTOLIN HFA) 108 (90 Base) MCG/ACT inhaler Inhale 1-2 puffs into the lungs every 6 (six) hours as needed. 3 each 3   amLODipine (NORVASC) 5 MG tablet Take 5 mg by mouth daily.     artificial tears (LACRILUBE) OINT ophthalmic ointment Place 1 application into the left eye at bedtime as needed (for dry/red/irritated eyes.).     aspirin EC 81 MG tablet Take 81 mg by mouth daily.     cyclobenzaprine (FLEXERIL) 10 MG tablet Take 10 mg by mouth 3 (three) times daily as needed.     Docusate Sodium (COLACE PO) Take 100 mg by mouth 2 (two) times daily.      FARXIGA 10 MG TABS tablet Take 10 mg by mouth daily.     ferrous sulfate 324 MG TBEC Take 324 mg by mouth.      Loratadine (CLARITIN) 10 MG CAPS Take 10 mg by mouth daily.     metFORMIN (GLUCOPHAGE) 500 MG tablet Take 500 mg by mouth 2 (two) times daily.     nitroGLYCERIN (NITROSTAT) 0.4 MG SL tablet Place 1 tablet under the tongue every 5 (five) minutes as needed for chest pain.      omeprazole (PRILOSEC) 20 MG capsule Take 20 mg by mouth 2 (two) times daily.     PARoxetine (PAXIL) 10 MG tablet Take 10 mg by mouth daily.     rOPINIRole (REQUIP) 0.25 MG tablet Take 0.25 mg by mouth 3 (three) times daily as needed. For leg cramps/restless legs.     rosuvastatin (CRESTOR) 40 MG tablet Take 1 tablet (40 mg total) by mouth daily. 30 tablet 6   umeclidinium-vilanterol (ANORO ELLIPTA) 62.5-25 MCG/ACT AEPB Inhale 1 puff into the lungs daily. 3 each 3   Vitamin D, Ergocalciferol, (DRISDOL) 1.25 MG (50000 UNIT) CAPS capsule Take 50,000 Units by mouth every 7 (seven) days.     No facility-administered medications prior to visit.    Review of Systems  Constitutional:  Negative for chills, fever, malaise/fatigue and weight loss.  HENT:  Negative for congestion, sinus pain and sore throat.   Eyes: Negative.   Respiratory:  Positive for shortness of breath. Negative for cough, hemoptysis, sputum production and wheezing.   Cardiovascular:  Negative for chest pain, palpitations, orthopnea, claudication and leg swelling.  Gastrointestinal:  Positive for heartburn. Negative for abdominal pain, nausea and vomiting.  Genitourinary: Negative.   Musculoskeletal:  Positive for joint pain. Negative for myalgias.  Skin:  Negative for rash.  Neurological:  Negative for weakness.  Endo/Heme/Allergies: Negative.   Psychiatric/Behavioral: Negative.     Objective:   There were no vitals filed for this visit.  Physical Exam Constitutional:      General: She is not in acute distress.    Appearance: She is not ill-appearing.  HENT:     Head: Normocephalic and atraumatic.  Eyes:     General: No scleral icterus.     Conjunctiva/sclera: Conjunctivae normal.     Pupils: Pupils are equal, round, and reactive to light.  Cardiovascular:     Rate and Rhythm: Normal rate and regular rhythm.     Pulses: Normal pulses.     Heart sounds: Normal heart sounds. No murmur heard. Pulmonary:     Effort: Pulmonary effort is normal.  Breath sounds: Normal breath sounds. No wheezing, rhonchi or rales.  Abdominal:     General: Bowel sounds are normal.     Palpations: Abdomen is soft.  Musculoskeletal:     Right lower leg: No edema.     Left lower leg: No edema.  Lymphadenopathy:     Cervical: No cervical adenopathy.  Skin:    General: Skin is warm and dry.  Neurological:     General: No focal deficit present.     Mental Status: She is alert.  Psychiatric:        Mood and Affect: Mood normal.        Behavior: Behavior normal.        Thought Content: Thought content normal.        Judgment: Judgment normal.    CBC    Component Value Date/Time   WBC 7.3 04/23/2016 0252   RBC 3.49 (L) 04/23/2016 0252   HGB 8.4 (L) 04/23/2016 0252   HCT 27.5 (L) 04/23/2016 0252   PLT 300 04/23/2016 0252   MCV 78.8 04/23/2016 0252   MCH 24.1 (L) 04/23/2016 0252   MCHC 30.5 04/23/2016 0252   RDW 16.3 (H) 04/23/2016 0252   LYMPHSABS 2,225 10/30/2015 1049   MONOABS 801 10/30/2015 1049   EOSABS 178 10/30/2015 1049   BASOSABS 0 10/30/2015 1049      Latest Ref Rng & Units 04/23/2016    2:52 AM 04/22/2016    2:18 AM 04/21/2016    3:09 AM  BMP  Glucose 65 - 99 mg/dL 784  696  295   BUN 6 - 20 mg/dL 6  5  5    Creatinine 0.44 - 1.00 mg/dL 2.84  1.32  4.40   Sodium 135 - 145 mmol/L 139  137  138   Potassium 3.5 - 5.1 mmol/L 3.3  3.6  4.0   Chloride 101 - 111 mmol/L 102  104  105   CO2 22 - 32 mmol/L 28  25  28    Calcium 8.9 - 10.3 mg/dL 8.9  8.5  8.3    Chest imaging: CT Chest 08/29/22 1. Lung-RADS 2S, benign appearance or behavior. Continue annual screening with low-dose chest CT without contrast in 12 months. 2.  The "S" modifier above refers to potentially clinically significant non lung cancer related findings. Specifically, there is aortic atherosclerosis, in addition to left main and three-vessel coronary artery disease. Please note that although the presence of coronary artery calcium documents the presence of coronary artery disease, the severity of this disease and any potential stenosis cannot be assessed on this non-gated CT examination. Assessment for potential risk factor modification, dietary therapy or pharmacologic therapy may be warranted, if clinically indicated. 3. There are also findings in the lungs concerning for interstitial lung disease, with a spectrum of findings considered most likely to reflect an alternative diagnosis (not usual interstitial pneumonia) at this time. Outpatient referral to Pulmonology for further clinical evaluation is recommended. 4. Mild diffuse bronchial wall thickening with very mild centrilobular and paraseptal emphysema; imaging findings suggestive of underlying COPD.  PFT:    Latest Ref Rng & Units 03/11/2023    2:15 PM  PFT Results  FVC-Pre L 1.78   FVC-Predicted Pre % 61   Pre FEV1/FVC % % 68   FEV1-Pre L 1.20   FEV1-Predicted Pre % 54   DLCO uncorrected ml/min/mmHg 9.22   DLCO UNC% % 48   DLCO corrected ml/min/mmHg 9.22   DLCO COR %Predicted % 48   DLVA  Predicted % 95     Labs:  Path:  Echo:  Heart Catheterization:  Assessment & Plan:   No diagnosis found.  Discussion: Debra Dennis is a 67 year old woman, daily smoker with DMII, GERD, CAD and TIA who is referred to pulmonary clinic for abnormal chest imaging.   She has CT Chest scan findings concerning for NSIP. She has family history of Sjogren's syndrome. She also has significant smoking history.   Fortunately she is not very symptomatic at this time. She does not wish to try inhaler therapy.  We will check inflammatory lab workup for autoimmune lung  disease.  Recommend smoking cessation with nicotine replacement therapy with 14mg  nicotine patches and as needed mini nicotine lozenges.   Follow up in 2 months with pulmonary function tests.   Debra Comas, MD [facility] Office: [phone]   Current Outpatient Medications:    albuterol (VENTOLIN HFA) 108 (90 Base) MCG/ACT inhaler, Inhale 1-2 puffs into the lungs every 6 (six) hours as needed., Disp: 3 each, Rfl: 3   amLODipine (NORVASC) 5 MG tablet, Take 5 mg by mouth daily., Disp: , Rfl:    artificial tears (LACRILUBE) OINT ophthalmic ointment, Place 1 application into the left eye at bedtime as needed (for dry/red/irritated eyes.)., Disp: , Rfl:    aspirin EC 81 MG tablet, Take 81 mg by mouth daily., Disp: , Rfl:    cyclobenzaprine (FLEXERIL) 10 MG tablet, Take 10 mg by mouth 3 (three) times daily as needed., Disp: , Rfl:    Docusate Sodium (COLACE PO), Take 100 mg by mouth 2 (two) times daily. , Disp: , Rfl:    FARXIGA 10 MG TABS tablet, Take 10 mg by mouth daily., Disp: , Rfl:    ferrous sulfate 324 MG TBEC, Take 324 mg by mouth., Disp: , Rfl:    Loratadine (CLARITIN) 10 MG CAPS, Take 10 mg by mouth daily., Disp: , Rfl:    metFORMIN (GLUCOPHAGE) 500 MG tablet, Take 500 mg by mouth 2 (two) times daily., Disp: , Rfl:    nitroGLYCERIN (NITROSTAT) 0.4 MG SL tablet, Place 1 tablet under the tongue every 5 (five) minutes as needed for chest pain. , Disp: , Rfl:    omeprazole (PRILOSEC) 20 MG capsule, Take 20 mg by mouth 2 (two) times daily., Disp: , Rfl:    PARoxetine (PAXIL) 10 MG tablet, Take 10 mg by mouth daily., Disp: , Rfl:    rOPINIRole (REQUIP) 0.25 MG tablet, Take 0.25 mg by mouth 3 (three) times daily as needed. For leg cramps/restless legs., Disp: , Rfl:    rosuvastatin (CRESTOR) 40 MG tablet, Take 1 tablet (40 mg total) by mouth daily., Disp: 30 tablet, Rfl: 6   umeclidinium-vilanterol (ANORO ELLIPTA) 62.5-25 MCG/ACT AEPB, Inhale 1 puff into the lungs  daily., Disp: 3 each, Rfl: 3   Vitamin D, Ergocalciferol, (DRISDOL) 1.25 MG (50000 UNIT) CAPS capsule, Take 50,000 Units by mouth every 7 (seven) days., Disp: , Rfl:

## 2023-08-12 ENCOUNTER — Ambulatory Visit
Admission: RE | Admit: 2023-08-12 | Discharge: 2023-08-12 | Disposition: A | Discharge status: Home / Self Care | Payer: Medicare HMO | Source: Ambulatory Visit | Attending: Family Medicine | Admitting: Family Medicine

## 2023-08-12 DIAGNOSIS — N289 Disorder of kidney and ureter, unspecified: Secondary | ICD-10-CM

## 2023-08-18 ENCOUNTER — Other Ambulatory Visit: Payer: Self-pay | Admitting: Adult Health

## 2023-10-12 ENCOUNTER — Ambulatory Visit (HOSPITAL_COMMUNITY): Admission: RE | Admit: 2023-10-12 | Payer: Medicare HMO | Source: Ambulatory Visit

## 2023-11-12 DIAGNOSIS — H5213 Myopia, bilateral: Secondary | ICD-10-CM | POA: Diagnosis not present

## 2023-11-12 DIAGNOSIS — H43813 Vitreous degeneration, bilateral: Secondary | ICD-10-CM | POA: Diagnosis not present

## 2023-11-12 DIAGNOSIS — H25813 Combined forms of age-related cataract, bilateral: Secondary | ICD-10-CM | POA: Diagnosis not present

## 2023-11-12 DIAGNOSIS — H04123 Dry eye syndrome of bilateral lacrimal glands: Secondary | ICD-10-CM | POA: Diagnosis not present

## 2023-11-12 DIAGNOSIS — H524 Presbyopia: Secondary | ICD-10-CM | POA: Diagnosis not present

## 2023-11-12 DIAGNOSIS — E119 Type 2 diabetes mellitus without complications: Secondary | ICD-10-CM | POA: Diagnosis not present

## 2023-11-12 DIAGNOSIS — H52223 Regular astigmatism, bilateral: Secondary | ICD-10-CM | POA: Diagnosis not present

## 2023-12-05 DIAGNOSIS — J439 Emphysema, unspecified: Secondary | ICD-10-CM | POA: Diagnosis not present

## 2023-12-05 DIAGNOSIS — I10 Essential (primary) hypertension: Secondary | ICD-10-CM | POA: Diagnosis not present

## 2023-12-05 DIAGNOSIS — F32A Depression, unspecified: Secondary | ICD-10-CM | POA: Diagnosis not present

## 2023-12-05 DIAGNOSIS — E119 Type 2 diabetes mellitus without complications: Secondary | ICD-10-CM | POA: Diagnosis not present

## 2024-01-04 DIAGNOSIS — I10 Essential (primary) hypertension: Secondary | ICD-10-CM | POA: Diagnosis not present

## 2024-01-04 DIAGNOSIS — E119 Type 2 diabetes mellitus without complications: Secondary | ICD-10-CM | POA: Diagnosis not present

## 2024-01-04 DIAGNOSIS — J439 Emphysema, unspecified: Secondary | ICD-10-CM | POA: Diagnosis not present

## 2024-01-04 DIAGNOSIS — F32A Depression, unspecified: Secondary | ICD-10-CM | POA: Diagnosis not present

## 2024-02-04 DIAGNOSIS — I10 Essential (primary) hypertension: Secondary | ICD-10-CM | POA: Diagnosis not present

## 2024-02-04 DIAGNOSIS — F32A Depression, unspecified: Secondary | ICD-10-CM | POA: Diagnosis not present

## 2024-02-04 DIAGNOSIS — E119 Type 2 diabetes mellitus without complications: Secondary | ICD-10-CM | POA: Diagnosis not present

## 2024-02-04 DIAGNOSIS — J439 Emphysema, unspecified: Secondary | ICD-10-CM | POA: Diagnosis not present

## 2024-03-06 DIAGNOSIS — E119 Type 2 diabetes mellitus without complications: Secondary | ICD-10-CM | POA: Diagnosis not present

## 2024-03-06 DIAGNOSIS — J439 Emphysema, unspecified: Secondary | ICD-10-CM | POA: Diagnosis not present

## 2024-03-06 DIAGNOSIS — I10 Essential (primary) hypertension: Secondary | ICD-10-CM | POA: Diagnosis not present

## 2024-03-06 DIAGNOSIS — F32A Depression, unspecified: Secondary | ICD-10-CM | POA: Diagnosis not present

## 2024-03-09 DIAGNOSIS — Z008 Encounter for other general examination: Secondary | ICD-10-CM | POA: Diagnosis not present

## 2024-04-05 DIAGNOSIS — E119 Type 2 diabetes mellitus without complications: Secondary | ICD-10-CM | POA: Diagnosis not present

## 2024-04-05 DIAGNOSIS — I10 Essential (primary) hypertension: Secondary | ICD-10-CM | POA: Diagnosis not present

## 2024-04-05 DIAGNOSIS — J439 Emphysema, unspecified: Secondary | ICD-10-CM | POA: Diagnosis not present

## 2024-04-05 DIAGNOSIS — F32A Depression, unspecified: Secondary | ICD-10-CM | POA: Diagnosis not present

## 2024-05-02 ENCOUNTER — Other Ambulatory Visit: Payer: Self-pay | Admitting: Adult Health

## 2024-05-03 NOTE — Telephone Encounter (Signed)
 Courtesy refill

## 2024-05-06 DIAGNOSIS — E119 Type 2 diabetes mellitus without complications: Secondary | ICD-10-CM | POA: Diagnosis not present

## 2024-05-06 DIAGNOSIS — J439 Emphysema, unspecified: Secondary | ICD-10-CM | POA: Diagnosis not present

## 2024-05-06 DIAGNOSIS — I10 Essential (primary) hypertension: Secondary | ICD-10-CM | POA: Diagnosis not present

## 2024-05-06 DIAGNOSIS — F32A Depression, unspecified: Secondary | ICD-10-CM | POA: Diagnosis not present

## 2024-06-05 DIAGNOSIS — F32A Depression, unspecified: Secondary | ICD-10-CM | POA: Diagnosis not present

## 2024-06-05 DIAGNOSIS — I10 Essential (primary) hypertension: Secondary | ICD-10-CM | POA: Diagnosis not present

## 2024-06-05 DIAGNOSIS — J439 Emphysema, unspecified: Secondary | ICD-10-CM | POA: Diagnosis not present

## 2024-06-05 DIAGNOSIS — E119 Type 2 diabetes mellitus without complications: Secondary | ICD-10-CM | POA: Diagnosis not present

## 2024-07-21 NOTE — Progress Notes (Signed)
 " Cardiology Office Note:  .   Date:  07/21/2024  ID:  Debra Dennis, DOB 06/16/1956, MRN [id number] PCP: Frederik Charleston, MD  [facility] Providers Cardiologist:  Georganna Archer, MD { Chief Complaint:  Chief Complaint  Patient presents with   Follow-up    History of Present Illness: .    Debra Dennis is a 69 y.o. female with a PMH of CAD s/p PCI to Lcx (2016), HTN, HLD, PAD, bilateral CAS, AAA s/p repair (2017), tobacco use disorder, DM2 and prior TIAs who presents for follow-up.   Discussed the use of AI scribe software for clinical note transcription with the patient, who gave verbal consent to proceed.  History of Present Illness Debra Dennis is a 69 year old female with coronary artery disease who presents to reestablish care with cardiology. She was referred by her primary care physician to reestablish care with cardiology.  She has a history of coronary artery disease with a prior percutaneous coronary intervention to the left circumflex artery. Her medical history also includes high blood pressure, high cholesterol, peripheral arterial disease, carotid artery stenosis, and an abdominal aortic aneurysm repair in 2017. She was last seen by [facility] in 2024.  She experiences shortness of breath at times, which she attributes to her COPD. She has a significant smoking history, smoking a pack a day since she was 59 or 69 years old. She has attempted to quit smoking multiple times, including using patches.  Her current medications include amlodipine  for blood pressure, aspirin , Farxiga (dapagliflozin), and Crestor  40 mg daily for cholesterol.  She does not engage in structured exercise but tries to walk, although [place] which limits her activity. Her diet includes fruits, vegetables, and she avoids red meats and fried foods. She lives with her son, who recently had a myocardial infarction, and his dietary habits influence her eating habits as  well.      Studies Reviewed: SABRA    EKG:  EKG Interpretation Date/Time:  Friday July 22 2024 13:47:52 EST Ventricular Rate:  79 PR Interval:  150 QRS Duration:  64 QT Interval:  374 QTC Calculation: 428 R Axis:   -11  Text Interpretation: Sinus rhythm with occasional Premature ventricular complexes Low voltage QRS Septal infarct (cited on or before 18-Apr-2016) When compared with ECG of 18-Apr-2016 14:24, Premature ventricular complexes are now Present QT has shortened Confirmed by Archer Georganna [phone]) on 07/22/2024 2:02:29 PM     Cardiac Studies & Procedures   ______________________________________________________________________________________________   STRESS TESTS  PCV MYOCARDIAL PERFUSION WO LEXISCAN  10/02/2022  Interpretation Summary Images from the original result were not included. Regadenoson  Nuclear stress test 10/02/2022: Myocardial perfusion is without ischemia. There is a soft tissue attenuation defect in the inferior and apical regions. Overall LV systolic function is normal without regional wall motion abnormalities. Stress LV EF: 63%. Non diagnostic stress EKG. The heart rate response was consistent with Regadenoson . No previous exam available for comparison. Low risk.   ECHOCARDIOGRAM  PCV ECHOCARDIOGRAM COMPLETE 10/14/2022  Narrative Echocardiogram 10/14/2022: Normal LV systolic function with visual EF 60-65%. Left ventricle cavity is normal in size. Mild concentric hypertrophy of the left ventricle. Normal global wall motion. Normal diastolic filling pattern, normal LAP. Calculated EF 75%. Structurally normal trileaflet aortic valve.  Mild (Grade I) aortic regurgitation. Structurally normal tricuspid valve with no regurgitation. No evidence of pulmonary hypertension.          ______________________________________________________________________________________________      Results  Risk Assessment/Calculations:                Physical Exam:    VS:  BP (!) 110/50 (BP Location: Left Arm, Patient Position: Sitting, Cuff Size: Normal)   Pulse 79   Ht 5' 3 (1.6 m)   Wt 132 lb 11.2 oz (60.2 kg)   SpO2 96%   BMI 23.51 kg/m      Wt Readings from Last 3 Encounters:  03/12/23 138 lb 6.4 oz (62.8 kg)  11/10/22 139 lb (63 kg)  09/25/22 132 lb (59.9 kg)     GEN: Well nourished, well developed, in no acute distress NECK: No JVD; bilateral carotid bruits L>R CARDIAC: RRR, no murmurs, rubs, gallops RESPIRATORY: Coarse crackles heard throughout the lung fields, no wheezes or rhonchi ABDOMEN: Soft, non-tender, non-distended, normal bowel sounds EXTREMITIES:  Warm and well perfused, no edema; No deformity, 2+ radial pulses PSYCH: Normal mood and affect   ASSESSMENT AND PLAN: .    #CAD s/p PCI to Lcx at [facility] (2016) - Single-vessel CAD to the LCX s/p stenting. - No symptoms of chest pain. Continue aspirin  81 mg daily indefinitely  #Bilateral CAS -Known bilateral CAS but has been sometime since she has had a vascular ultrasound. -We will get a VASc ultrasound to see if her disease has progressed. Bilateral carotid ultrasound  #HTN - BP is at goal.  No changes. Continue amlodipine  5 mg daily Continue lisinopril 2.5 mg daily  #HLD - Due for lipid panel. Lipid panel Continue Crestor  40 mg daily  #Tobacco Use Disorder - She is a longstanding smoker and still smokes 1 pack a day. - She is interested in quitting, but has not found success with prior treatments including nicotine patches and Wellbutrin . - She is interested in trying Chantix .  She denies having any active SI/HI or any prior history of SI/HI.  She further denies depression or mood disorders. - I will start the patient on Chantix  and follow-up in 3 months to see how she is doing on it.  I instructed her to discontinue the medication and notify me if she notices changes in her mood.  The risk of cardiovascular injury with Chantix  is fairly low so  I think it is safe for her to start from a cardiovascular standpoint. Start Chantix  Follow-up in 3 months         This note was written with the assistance of a dictation microphone or AI dictation software. Please excuse any typos or grammatical errors.   Signed, Georganna Archer, MD  07/21/2024 9:19 PM    [facility] "

## 2024-07-22 ENCOUNTER — Other Ambulatory Visit (HOSPITAL_COMMUNITY): Payer: Self-pay

## 2024-07-22 ENCOUNTER — Other Ambulatory Visit: Payer: Self-pay | Admitting: Adult Health

## 2024-07-22 ENCOUNTER — Ambulatory Visit: Admitting: Student in an Organized Health Care Education/Training Program

## 2024-07-22 ENCOUNTER — Encounter: Payer: Self-pay | Admitting: Student in an Organized Health Care Education/Training Program

## 2024-07-22 VITALS — BP 110/50 | HR 79 | Ht 63.0 in | Wt 132.7 lb

## 2024-07-22 DIAGNOSIS — R0989 Other specified symptoms and signs involving the circulatory and respiratory systems: Secondary | ICD-10-CM | POA: Diagnosis not present

## 2024-07-22 DIAGNOSIS — I2510 Atherosclerotic heart disease of native coronary artery without angina pectoris: Secondary | ICD-10-CM

## 2024-07-22 DIAGNOSIS — E782 Mixed hyperlipidemia: Secondary | ICD-10-CM

## 2024-07-22 DIAGNOSIS — I10 Essential (primary) hypertension: Secondary | ICD-10-CM

## 2024-07-22 MED ORDER — VARENICLINE TARTRATE (STARTER) 0.5 MG X 11 & 1 MG X 42 PO TBPK
ORAL_TABLET | ORAL | 0 refills | Status: AC
  Filled 2024-07-22: qty 53, 28d supply, fill #0

## 2024-07-22 NOTE — Patient Instructions (Signed)
 Medication Instructions:  START Chantix  Starter Pack   *If you need a refill on your cardiac medications before your next appointment, please call your pharmacy*  Lab Work: Lipid panel  BMP  If you have labs (blood work) drawn today and your tests are completely normal, you will receive your results only by: MyChart Message (if you have MyChart) OR A paper copy in the mail If you have any lab test that is abnormal or we need to change your treatment, we will call you to review the results.  Testing/Procedures: CAROTID ULTRASOUND  Your physician has requested that you have a carotid duplex. This test is an ultrasound of the carotid arteries in your neck. It looks at blood flow through these arteries that supply the brain with blood. Allow one hour for this exam. There are no restrictions or special instructions.   Follow-Up: At [facility], you and your health needs are our priority.  As part of our continuing mission to provide you with exceptional heart care, our providers are all part of one team.  This team includes your primary Cardiologist (physician) and Advanced Practice Providers or APPs (Physician Assistants and Nurse Practitioners) who all work together to provide you with the care you need, when you need it.  Your next appointment:   3 month(s)  Provider:   Georganna Archer, MD

## 2024-07-23 LAB — LIPID PANEL
Chol/HDL Ratio: 2.3 ratio (ref 0.0–4.4)
Cholesterol, Total: 124 mg/dL (ref 100–199)
HDL: 55 mg/dL
LDL Chol Calc (NIH): 51 mg/dL (ref 0–99)
Triglycerides: 93 mg/dL (ref 0–149)
VLDL Cholesterol Cal: 18 mg/dL (ref 5–40)

## 2024-07-23 LAB — BASIC METABOLIC PANEL WITH GFR
BUN/Creatinine Ratio: 8 — ABNORMAL LOW (ref 12–28)
BUN: 14 mg/dL (ref 8–27)
CO2: 19 mmol/L — ABNORMAL LOW (ref 20–29)
Calcium: 10.2 mg/dL (ref 8.7–10.3)
Chloride: 102 mmol/L (ref 96–106)
Creatinine, Ser: 1.71 mg/dL — ABNORMAL HIGH (ref 0.57–1.00)
Glucose: 89 mg/dL (ref 70–99)
Potassium: 4 mmol/L (ref 3.5–5.2)
Sodium: 139 mmol/L (ref 134–144)
eGFR: 32 mL/min/1.73 — ABNORMAL LOW

## 2024-07-24 ENCOUNTER — Ambulatory Visit: Payer: Self-pay | Admitting: Student in an Organized Health Care Education/Training Program

## 2024-08-15 ENCOUNTER — Ambulatory Visit (HOSPITAL_COMMUNITY)

## 2024-10-10 ENCOUNTER — Ambulatory Visit: Admitting: Student in an Organized Health Care Education/Training Program
